# Patient Record
Sex: Female | Born: 1990 | Race: White | Hispanic: No | Marital: Married | Smoking: Never smoker
Health system: Southern US, Community
[De-identification: ages and names within clinical notes are randomized; demographics above are authoritative.]

## PROBLEM LIST (undated history)

## (undated) DIAGNOSIS — T7840XA Allergy, unspecified, initial encounter: Secondary | ICD-10-CM

## (undated) DIAGNOSIS — K589 Irritable bowel syndrome without diarrhea: Secondary | ICD-10-CM

## (undated) DIAGNOSIS — G43909 Migraine, unspecified, not intractable, without status migrainosus: Secondary | ICD-10-CM

## (undated) HISTORY — DX: Migraine, unspecified, not intractable, without status migrainosus: G43.909

## (undated) HISTORY — DX: Allergy, unspecified, initial encounter: T78.40XA

## (undated) HISTORY — DX: Irritable bowel syndrome without diarrhea: K58.9

---
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
## 2014-01-27 ENCOUNTER — Ambulatory Visit: Payer: Self-pay | Admitting: Internal Medicine

## 2014-01-28 ENCOUNTER — Ambulatory Visit (INDEPENDENT_AMBULATORY_CARE_PROVIDER_SITE_OTHER): Payer: BC Managed Care – PPO | Admitting: Internal Medicine

## 2014-01-28 ENCOUNTER — Encounter: Payer: Self-pay | Admitting: Internal Medicine

## 2014-01-28 VITALS — BP 118/72 | HR 100 | Temp 99.0°F | Ht 68.5 in | Wt 184.2 lb

## 2014-01-28 DIAGNOSIS — R110 Nausea: Secondary | ICD-10-CM

## 2014-01-28 DIAGNOSIS — R51 Headache: Secondary | ICD-10-CM

## 2014-01-28 DIAGNOSIS — R109 Unspecified abdominal pain: Secondary | ICD-10-CM

## 2014-01-28 DIAGNOSIS — R519 Headache, unspecified: Secondary | ICD-10-CM

## 2014-01-28 LAB — CBC
HCT: 36.5 % (ref 36.0–46.0)
Hemoglobin: 12.5 g/dL (ref 12.0–15.0)
MCHC: 34.2 g/dL (ref 30.0–36.0)
MCV: 85.8 fl (ref 78.0–100.0)
Platelets: 265 10*3/uL (ref 150.0–400.0)
RBC: 4.25 Mil/uL (ref 3.87–5.11)
RDW: 12.8 % (ref 11.5–14.6)
WBC: 6.4 10*3/uL (ref 4.5–10.5)

## 2014-01-28 LAB — COMPREHENSIVE METABOLIC PANEL
ALBUMIN: 3.8 g/dL (ref 3.5–5.2)
ALK PHOS: 60 U/L (ref 39–117)
ALT: 26 U/L (ref 0–35)
AST: 29 U/L (ref 0–37)
BILIRUBIN TOTAL: 0.5 mg/dL (ref 0.3–1.2)
BUN: 9 mg/dL (ref 6–23)
CO2: 20 mEq/L (ref 19–32)
Calcium: 9.5 mg/dL (ref 8.4–10.5)
Chloride: 106 mEq/L (ref 96–112)
Creatinine, Ser: 0.6 mg/dL (ref 0.4–1.2)
GFR: 124.14 mL/min (ref >60.00)
GLUCOSE: 77 mg/dL (ref 70–99)
POTASSIUM: 4 meq/L (ref 3.5–5.1)
SODIUM: 137 meq/L (ref 135–145)
TOTAL PROTEIN: 7.1 g/dL (ref 6.0–8.3)

## 2014-01-28 LAB — TSH: TSH: 1.8 u[IU]/mL (ref 0.35–5.50)

## 2014-01-28 MED ORDER — SUMATRIPTAN SUCCINATE 25 MG PO TABS: 25.0000 mg | ORAL_TABLET | ORAL | Status: DC | PRN

## 2014-01-28 MED ORDER — TOPIRAMATE 25 MG PO TABS: 25.0000 mg | ORAL_TABLET | Freq: Two times a day (BID) | ORAL | Status: DC

## 2014-01-28 NOTE — Patient Instructions (Addendum)

## 2014-01-28 NOTE — Progress Notes (Signed)
Pre visit review using our clinic review tool, if applicable. No additional management support is needed unless otherwise documented below in the visit note. 

## 2014-01-28 NOTE — Progress Notes (Signed)
HPI  Pt presents to the clinic today to establish care. She moved from [place] 18 months ago and has not established care since that time. She does have a few concerns today.    Abdominal pain nausea:  Pt states that for the past few weeks she has been waking up with lower quadrant stomach cramps, diarrhea, nausea, and occasionally some vomiting. Denies any blood in stool or vomit and denies these symptoms being related to any particular foods. States the diarrhea is worse on the weekends. Pt does have an increase of stress recently with planning a wedding and work. LMP was at the beginning of this month.  Headaches:  Pt also states that she has a history of migraines without aura and since November has had a daily HA that is achy in quality and stretches across forehead. These HAs have become worse in the last week.   Flu: Not this year  Tetanus:  LMP: 3 weeks ago  Dentist: as needed Eye Doctor: 08/2013  Past Medical History  Diagnosis Date  . Allergy     Current Outpatient Prescriptions  Medication Sig Dispense Refill  . drospirenone-ethinyl estradiol (YAZ,GIANVI,LORYNA) 3-0.02 MG tablet Take 1 tablet by mouth daily.       No current facility-administered medications for this visit.    No Known Allergies  Family History  Problem Relation Age of Onset  . Depression Mother   . Depression Maternal Uncle   . Depression Maternal Grandmother   . Heart disease Maternal Grandfather   . Hyperlipidemia Maternal Grandfather   . Hypertension Maternal Grandfather   . Diabetes Neg Hx   . Stroke Neg Hx   . Cancer Neg Hx     History   Social History  . Marital Status: Married    Spouse Name: N/A    Number of Children: N/A  . Years of Education: N/A   Occupational History  . Not on file.   Social History Main Topics  . Smoking status: Never Smoker   . Smokeless tobacco: Not on file  . Alcohol Use: 1.2 oz/week    2 Cans of beer per week     Comment: occasional  . Drug Use:  No  . Sexual Activity: Yes    Birth Control/ Protection: Pill   Other Topics Concern  . Not on file   Social History Narrative  . No narrative on file    ROS:  Constitutional: Pt admits to daily HA. Denies fever, malaise, fatigue, or abrupt weight changes.  HEENT: Pt admits to eye burning. Denies ear pain, ringing in the ears, wax buildup, runny nose, nasal congestion, bloody nose, or sore throat. Respiratory: Denies difficulty breathing, shortness of breath, cough or sputum production.   Cardiovascular: Denies chest pain, chest tightness, palpitations or swelling in the hands or feet.  Gastrointestinal: Pt admits to daily diarrhea, nausea, and occasional vomiting. Denies bloating, constipation, or blood in the stool.  Neurological: Pt admits to mild dizziness. Denies difficulty with memory, difficulty with speech or problems with balance and coordination.   No other specific complaints in a complete review of systems (except as listed in HPI above).  PE:  BP 118/72  Pulse 100  Temp(Src) 99 F (37.2 C) (Oral)  Ht 5' 8.5" (1.74 m)  Wt 184 lb 4 oz (83.575 kg)  BMI 27.60 kg/m2  SpO2 99%  LMP 01/06/2014 Wt Readings from Last 3 Encounters:  01/28/14 184 lb 4 oz (83.575 kg)    General: Pleasant, appears her stated  age, well developed, well nourished in NAD. HEENT: Head: normal shape and size; Eyes: sclera white, no icterus. Throat/Mouth: Teeth present, mucosa pink and moist, no lesions or ulcerations noted.  Cardiovascular: Normal rate and rhythm. S1,S2 noted.  No murmur, rubs or gallops noted. No JVD or BLE edema. No carotid bruits noted. Pulmonary/Chest: Normal effort and positive vesicular breath sounds. No respiratory distress. No wheezes, rales or ronchi noted.  Abdomen: Soft but tender to palpation in in LLQ and mid abdomen. Normal bowel sounds, no bruits noted. No distention or masses noted. Liver, spleen and kidneys non palpable. Neurological: Alert and  oriented. Psychiatric: Mood and affect normal. Behavior is normal. Judgment and thought content normal.   Assessment and Plan:  Headache:   Rx Topamax 25 mg Qday and instructed the pt how to appropriately titrate up Q week by 25 mg to alleviate HA sxs to a max dose of 150 mg.   Instructed pt to call and let me know what dose works for alleviating HA sxs.   Rx Imitrex 25 mg for abortive Ha therapy.    Diarrhea:   Advised the pt to take Imodium OTC and pepto bismol Q day for sx management.   Will check CBC, CMET, and TSH to evaluate for cause of diarrhea.   Will F/U with pt once have results to determine need to further work up or GI referral.

## 2014-01-31 ENCOUNTER — Telehealth: Payer: Self-pay | Admitting: Internal Medicine

## 2014-01-31 ENCOUNTER — Other Ambulatory Visit: Payer: Self-pay | Admitting: Internal Medicine

## 2014-01-31 NOTE — Telephone Encounter (Signed)
Please see msg below

## 2014-01-31 NOTE — Telephone Encounter (Signed)
Pt is calling and says that the medication that she was given to take for Headaches and stomach pain is not working and she says she feels worse than before. Please advise. [facility] [place].

## 2014-02-01 ENCOUNTER — Encounter: Payer: Self-pay | Admitting: Internal Medicine

## 2014-02-01 ENCOUNTER — Encounter: Payer: Self-pay | Admitting: Gastroenterology

## 2014-02-01 ENCOUNTER — Other Ambulatory Visit: Payer: Self-pay

## 2014-02-01 ENCOUNTER — Ambulatory Visit (INDEPENDENT_AMBULATORY_CARE_PROVIDER_SITE_OTHER): Payer: BC Managed Care – PPO | Admitting: Internal Medicine

## 2014-02-01 VITALS — BP 112/82 | HR 77 | Temp 98.7°F | Wt 182.8 lb

## 2014-02-01 DIAGNOSIS — R51 Headache: Secondary | ICD-10-CM

## 2014-02-01 DIAGNOSIS — R109 Unspecified abdominal pain: Secondary | ICD-10-CM

## 2014-02-01 DIAGNOSIS — G8929 Other chronic pain: Secondary | ICD-10-CM

## 2014-02-01 DIAGNOSIS — R519 Headache, unspecified: Secondary | ICD-10-CM

## 2014-02-01 MED ORDER — SUMATRIPTAN SUCCINATE 25 MG PO TABS: 25.0000 mg | ORAL_TABLET | ORAL | Status: DC | PRN

## 2014-02-01 MED ORDER — DICYCLOMINE HCL 10 MG PO CAPS: 10.0000 mg | ORAL_CAPSULE | Freq: Three times a day (TID) | ORAL | Status: DC

## 2014-02-01 NOTE — Telephone Encounter (Signed)
She needs a 30 min followup OV if the med is not working

## 2014-02-01 NOTE — Progress Notes (Signed)
Subjective:    Patient ID: Audrey Short, female    DOB: 11/13/1990, 23 y.o.   MRN: [id number]  HPI  Pt presents to the clinic today for follow up for abdominal pain and headaches. She has been having daily headaches since November but these have worsened in the last 2 weeks. She was started on Imitrex and Topamax on 01/28/14. Pt stated that she was taking the Topamax 25 mg BID but took 10 pills of Imitrex in 4 days mistaking it as a medication to help stomach pains. She has vomited several times since Friday when she started the Imitrex and feels lightheaded, dizzy and flushed. Pt stated that her abdominal pain has also worsened since Friday with cramping in LLQ, RLQ, LUQ and RUQ. She denies any diarrhea with last BM being this morning. Pt denies hematochezia, melena, and acid reflux.   Review of Systems  Past Medical History  Diagnosis Date  . Allergy     Current Outpatient Prescriptions  Medication Sig Dispense Refill  . drospirenone-ethinyl estradiol (YAZ,GIANVI,LORYNA) 3-0.02 MG tablet Take 1 tablet by mouth daily.      Marland Kitchen. topiramate (TOPAMAX) 25 MG tablet Take 1 tablet (25 mg total) by mouth 2 (two) times daily.  30 tablet  1   No current facility-administered medications for this visit.   No Known Allergies  Family History  Problem Relation Age of Onset  . Depression Mother   . Depression Maternal Uncle   . Depression Maternal Grandmother   . Heart disease Maternal Grandfather   . Hyperlipidemia Maternal Grandfather   . Hypertension Maternal Grandfather   . Diabetes Neg Hx   . Stroke Neg Hx   . Cancer Neg Hx    History   Social History  . Marital Status: Married    Spouse Name: N/A    Number of Children: N/A  . Years of Education: N/A   Occupational History  . Not on file.   Social History Main Topics  . Smoking status: Never Smoker   . Smokeless tobacco: Not on file  . Alcohol Use: 1.2 oz/week    2 Cans of beer per week     Comment: occasional  . Drug Use:  No  . Sexual Activity: Yes    Birth Control/ Protection: Pill   Other Topics Concern  . Not on file   Social History Narrative  . No narrative on file   Constitutional: Pt admits to HA, flushing, and dizziness. Denies fever, malaise, fatigue, or abrupt weight changes.  Respiratory: Denies difficulty breathing, shortness of breath, cough or sputum production.   Cardiovascular: Denies chest pain, chest tightness, palpitations. Gastrointestinal: Pt admits to abdominal pain, bloating, nausea, and vomiting. Pt denies constipation, diarrhea or blood in the stool.   No other specific complaints in a complete review of systems (except as listed in HPI above).  Objective:   Physical Exam  BP 112/82  Pulse 77  Temp(Src) 98.7 F (37.1 C) (Oral)  Wt 182 lb 12 oz (82.895 kg)  SpO2 98%  LMP 01/06/2014 Wt Readings from Last 3 Encounters:  02/01/14 182 lb 12 oz (82.895 kg)  01/28/14 184 lb 4 oz (83.575 kg)   General: Appears her stated age, well developed, well nourished in NAD. Cardiovascular: Normal rate and rhythm. S1,S2 noted.  No murmur, rubs or gallops noted. No JVD or BLE edema. No carotid bruits noted. Pulmonary/Chest: Normal effort and positive vesicular breath sounds. No respiratory distress. No wheezes, rales or ronchi noted.  Abdomen: Soft  and nontender. Normal bowel sounds, no bruits noted. No distention or masses noted. Liver, spleen and kidneys non palpable.  BMET    Component Value Date/Time   NA 137 01/28/2014 1104   K 4.0 01/28/2014 1104   CL 106 01/28/2014 1104   CO2 20 01/28/2014 1104   GLUCOSE 77 01/28/2014 1104   BUN 9 01/28/2014 1104   CREATININE 0.6 01/28/2014 1104   CALCIUM 9.5 01/28/2014 1104   Lipid Panel  No results found for this basename: chol, trig, hdl, cholhdl, vldl, ldlcalc   CBC    Component Value Date/Time   WBC 6.4 01/28/2014 1104   RBC 4.25 01/28/2014 1104   HGB 12.5 01/28/2014 1104   HCT 36.5 01/28/2014 1104   PLT 265.0 01/28/2014 1104   MCV 85.8  01/28/2014 1104   MCHC 34.2 01/28/2014 1104   RDW 12.8 01/28/2014 1104   Hgb A1C No results found for this basename: HGBA1C   Assessment & Plan:   Abdominal Pain:   CBC, CMET, TSH all WNL Ordered TTG and H pylori tests Rx Bentyl 10 mg  Will refer to GI after have lab results for further work up of abdominal pain   Chronic Headaches:   The worsening of these HAs is likely 2/2 taking Imitrex and Topamax inappropriately as noted above  Extensive counseling and instruction done today on how to appropriately take medicines  Instructed the pt to continue Topamax 50 mg BID with titration up to 150 mg BID for HA prophylaxis  Rx for Immitrex sent   Will F/U with the pt about Topamax titration

## 2014-02-01 NOTE — Progress Notes (Signed)
Pre visit review using our clinic review tool, if applicable. No additional management support is needed unless otherwise documented below in the visit note. 

## 2014-02-01 NOTE — Telephone Encounter (Signed)
[facility]rex [facility]

## 2014-02-01 NOTE — Telephone Encounter (Signed)
Pt made a f/u appt for today

## 2014-02-01 NOTE — Patient Instructions (Addendum)
Recurrent Migraine Headache  A migraine headache is an intense, throbbing pain on one or both sides of your head. Recurrent migraines keep coming back. A migraine can last for 30 minutes to several hours.  CAUSES   The exact cause of a migraine headache is not always known. However, a migraine may be caused when nerves in the brain become irritated and release chemicals that cause inflammation. This causes pain.  Certain things may also trigger migraines, such as:   · Alcohol.  · Smoking.  · Stress.  · Menstruation.  · Aged cheeses.  · Foods or drinks that contain nitrates, glutamate, aspartame, or tyramine.  · Lack of sleep.  · Chocolate.  · Caffeine.  · Hunger.  · Physical exertion.  · Fatigue.  · Medicines used to treat chest pain (nitroglycerine), birth control pills, estrogen, and some blood pressure medicines.  SYMPTOMS   · Pain on one or both sides of your head.  · Pulsating or throbbing pain.  · Severe pain that prevents daily activities.  · Pain that is aggravated by any physical activity.  · Nausea, vomiting, or both.  · Dizziness.  · Pain with exposure to bright lights, loud noises, or activity.  · General sensitivity to bright lights, loud noises, or smells.  Before you get a migraine, you may get warning signs that a migraine is coming (aura). An aura may include:  · Seeing flashing lights.  · Seeing bright spots, halos, or zig-zag lines.  · Having tunnel vision or blurred vision.  · Having feelings of numbness or tingling.  · Having trouble talking.  · Having muscle weakness.  DIAGNOSIS   A recurrent migraine headache is often diagnosed based on:  · Symptoms.  · Physical examination.  · A CT scan or MRI of your head. These imaging tests cannot diagnose migraines, but can help rule out other causes of headaches.    TREATMENT   Medicines may be given for pain and nausea. Medicines can also be given to help prevent recurrent migraines.  HOME CARE INSTRUCTIONS  · Only take over-the-counter or  prescription medicines for pain or discomfort as directed by your health care provider. The use of long-term narcotics is not recommended.  · Lie down in a dark, quiet room when you have a migraine.  · Keep a journal to find out what may trigger your migraine headaches. For example, write down:  · What you eat and drink.  · How much sleep you get.  · Any change to your diet or medicines.  · Limit alcohol consumption.  · Quit smoking if you smoke.  · Get 7 9 hours of sleep, or as recommended by your health care provider.  · Limit stress.  · Keep lights dim if bright lights bother you and make your migraines worse.  SEEK MEDICAL CARE IF:   · You do not get relief from the medicines given to you.  · You have a recurrence of pain.  SEEK IMMEDIATE MEDICAL CARE IF:  · Your migraine becomes severe.  · You have a fever.  · You have a stiff neck.  · You have loss of vision.  · You have muscular weakness or loss of muscle control.  · You start losing your balance or have trouble walking.  · You feel faint or pass out.  · You have severe symptoms that are different from your first symptoms.  MAKE SURE YOU:   · Understand these instructions.  · Will watch your condition.  ·   Will get help right away if you are not doing well or get worse.  Document Released: 06/18/2001 Document Revised: 07/14/2013 Document Reviewed: 05/31/2013  ExitCare® Patient Information ©2014 [employer].

## 2014-02-01 NOTE — Telephone Encounter (Signed)
Left detailed msg on VM for pt to call back and make a 30 min OV f/u--per HIPAA

## 2014-02-02 LAB — GLIA (IGA/G) + TTG IGA
GLIADIN IGA: 7.5 U/mL (ref <20)
Gliadin IgG: 4.6 U/mL (ref <20)
Tissue Transglutaminase Ab, IgA: 4.6 U/mL (ref <20)

## 2014-02-03 ENCOUNTER — Ambulatory Visit: Payer: BC Managed Care – PPO

## 2014-02-03 DIAGNOSIS — R109 Unspecified abdominal pain: Secondary | ICD-10-CM

## 2014-02-04 ENCOUNTER — Telehealth: Payer: Self-pay

## 2014-02-04 LAB — H. PYLORI ANTIBODY, IGG: H Pylori IgG: <0.4 {ISR}

## 2014-02-04 NOTE — Telephone Encounter (Signed)
[facility], labs and demographics for patient--Spoke to Ricki MillerLinda Thomas printed off needed paperwork and I faxed to their office--will keep documents in my faxed folder just if needed again

## 2014-02-07 ENCOUNTER — Other Ambulatory Visit: Payer: Self-pay

## 2014-02-07 DIAGNOSIS — R51 Headache: Principal | ICD-10-CM

## 2014-02-07 DIAGNOSIS — R519 Headache, unspecified: Secondary | ICD-10-CM

## 2014-02-07 MED ORDER — TOPIRAMATE 25 MG PO TABS: 25.0000 mg | ORAL_TABLET | Freq: Two times a day (BID) | ORAL | Status: DC

## 2014-02-07 NOTE — Telephone Encounter (Signed)
Pt left v/m; pt said [facility] NP had told pt that she could take Topiramate 25 mg two tabs twice a day. pts med list has 1 tab twice a day. Pt is out of med and [facility] [place] [facility] because too early. Pt request new rx Topiramate 25 Mg with instructions two tabs twice a day to [facility] [place]. Pt request cb. Have not changed instructions on med list.

## 2014-02-07 NOTE — Telephone Encounter (Signed)
Is the Rx supposed to state 2 tablets BID #120? Or as you have sent in already?--please advise

## 2014-02-08 NOTE — Addendum Note (Signed)
Addended by: Roena MaladyEVONTENNO, MELANIE Y on: 02/08/2014 09:11 AM   Modules accepted: Orders

## 2014-02-08 NOTE — Telephone Encounter (Signed)
Rx changed in the chart and called the pharmacy to change as well--left detailed msg on pt's VM letting her know information as previously instructed

## 2014-02-08 NOTE — Telephone Encounter (Signed)
She is supposed to be titrating up on her topomax until the effective dose. If she is taking 2 tabs BID please give her 120 and remind her to call me back when she reaches an effective dose

## 2014-02-11 ENCOUNTER — Ambulatory Visit: Payer: Self-pay | Admitting: Gastroenterology

## 2014-02-14 ENCOUNTER — Encounter: Payer: Self-pay | Admitting: General Surgery

## 2014-02-21 ENCOUNTER — Telehealth: Payer: Self-pay

## 2014-02-21 NOTE — Telephone Encounter (Signed)
Ok, please change in computer Topomax 75 mg BID # 60 (no refill at this time)

## 2014-02-21 NOTE — Telephone Encounter (Signed)
There is not a 75 mg--so i just added 50 mg in addition to the 25 mg-- are you okay with this?

## 2014-02-21 NOTE — Telephone Encounter (Signed)
Pt left v/m; pt said topamax 25 mg taking 3 tabs in morning and 3 tabs at night is controlling pts h/as.pt wanted to get med list update for next refill.

## 2014-02-21 NOTE — Telephone Encounter (Signed)
Yes

## 2014-02-22 ENCOUNTER — Ambulatory Visit (INDEPENDENT_AMBULATORY_CARE_PROVIDER_SITE_OTHER): Payer: BC Managed Care – PPO | Admitting: General Surgery

## 2014-02-22 ENCOUNTER — Encounter: Payer: Self-pay | Admitting: General Surgery

## 2014-02-22 VITALS — BP 168/74 | HR 78 | Resp 14 | Ht 68.0 in | Wt 178.0 lb

## 2014-02-22 DIAGNOSIS — K819 Cholecystitis, unspecified: Secondary | ICD-10-CM | POA: Insufficient documentation

## 2014-02-22 NOTE — Patient Instructions (Signed)
Laparoscopic Cholecystectomy °Laparoscopic cholecystectomy is surgery to remove the gallbladder. The gallbladder is located in the upper right part of the abdomen, behind the liver. It is a storage sac for bile produced in the liver. Bile aids in the digestion and absorption of fats. Cholecystectomy is often done for inflammation of the gallbladder (cholecystitis). This condition is usually caused by a buildup of gallstones (cholelithiasis) in your gallbladder. Gallstones can block the flow of bile, resulting in inflammation and pain. In severe cases, emergency surgery may be required. When emergency surgery is not required, you will have time to prepare for the procedure. °Laparoscopic surgery is an alternative to open surgery. Laparoscopic surgery has a shorter recovery time. Your common bile duct may also need to be examined during the procedure. If stones are found in the common bile duct, they may be removed. °LET YOUR HEALTH CARE PROVIDER KNOW ABOUT: °· Any allergies you have. °· All medicines you are taking, including vitamins, herbs, eye drops, creams, and over-the-counter medicines. °· Previous problems you or members of your family have had with the use of anesthetics. °· Any blood disorders you have. °· Previous surgeries you have had. °· Medical conditions you have. °RISKS AND COMPLICATIONS °Generally, this is a safe procedure. However, as with any procedure, complications can occur. Possible complications include: °· Infection. °· Damage to the common bile duct, nerves, arteries, veins, or other internal organs such as the stomach, liver, or intestines. °· Bleeding. °· A stone may remain in the common bile duct. °· A bile leak from the cyst duct that is clipped when your gallbladder is removed. °· The need to convert to open surgery, which requires a larger incision in the abdomen. This may be necessary if your surgeon thinks it is not safe to continue with a laparoscopic procedure. °BEFORE THE  PROCEDURE °· Ask your health care provider about changing or stopping any regular medicines. You will need to stop taking aspirin or blood thinners at least 5 days prior to surgery. °· Do not eat or drink anything after midnight the night before surgery. °· Let your health care provider know if you develop a cold or other infectious problem before surgery. °PROCEDURE  °· You will be given medicine to make you sleep through the procedure (general anesthetic). A breathing tube will be placed in your mouth. °· When you are asleep, your surgeon will make several small cuts (incisions) in your abdomen. °· A thin, lighted tube with a tiny camera on the end (laparoscope) is inserted through one of the small incisions. The camera on the laparoscope sends a picture to a TV screen in the operating room. This gives the surgeon a good view inside your abdomen. °· A gas will be pumped into your abdomen. This expands your abdomen so that the surgeon has more room to perform the surgery. °· Other tools needed for the procedure are inserted through the other incisions. The gallbladder is removed through one of the incisions. °· After the removal of your gallbladder, the incisions will be closed with stitches, staples, or skin glue. °AFTER THE PROCEDURE °· You will be taken to a recovery area where your progress will be checked often. °· You may be allowed to go home the same day if your pain is controlled and you can tolerate liquids. °Document Released: 09/23/2005 Document Revised: 07/14/2013 Document Reviewed: 05/05/2013 °ExitCare® Patient Information ©2014 [employer]. ° °

## 2014-02-22 NOTE — Progress Notes (Signed)
Patient ID: Audrey Short, female   DOB: 01/13/1991, 23 y.o.   MRN: [id number]  Chief Complaint  Patient presents with  . Other    gallbladder    HPI Audrey Short is a 23 y.o. female here today for a evaluation of her gallbladder. Patient states she had a HIDA scan and ultrasound done on May 8,2015 . She states she is having abdominal pain in her right upper quadrant and has been going for about a year now. All foods make the pain worse .The  pain usually happen about 30 minutes after eating. She describes the pain as a sharp, stabbing pain. The pain is primarily in the right upper quadrant with some radiation of the left upper quadrant. States she is having diarrhea and vomiting. Family history of gallbladder problems (sister, mother and maternal grandmother).   Recently completed his abdominal ultrasound was negative. HIDA scan showed an ejection fraction of 80%, but she had reproduction of her pain beginning in the right upper quadrant moving of the left upper quadrant during CCK injection.  The patient reports that the institution of Bentyl has in some ways dull the pain she had been experiencing any postprandial timeframe, but has worsened her nausea, vomiting and loose stools.   HPI  Past Medical History  Diagnosis Date  . Allergy   . IBS (irritable bowel syndrome)   . Migraine     Past Surgical History  Procedure Laterality Date  . Tonsillectomy  2011    Family History  Problem Relation Age of Onset  . Depression Mother   . Depression Maternal Uncle   . Depression Maternal Grandmother   . Heart disease Maternal Grandfather   . Hyperlipidemia Maternal Grandfather   . Hypertension Maternal Grandfather   . Diabetes Neg Hx   . Stroke Neg Hx   . Cancer Neg Hx     Social History History  Substance Use Topics  . Smoking status: Never Smoker   . Smokeless tobacco: Never Used  . Alcohol Use: 1.2 oz/week    2 Cans of beer per week     Comment: occasional    No Known  Allergies  Current Outpatient Prescriptions  Medication Sig Dispense Refill  . dicyclomine (BENTYL) 10 MG capsule Take 1 capsule (10 mg total) by mouth 4 (four) times daily -  before meals and at bedtime.  30 capsule  2  . drospirenone-ethinyl estradiol (YAZ,GIANVI,LORYNA) 3-0.02 MG tablet Take 1 tablet by mouth daily.      . SUMAtriptan (IMITREX) 25 MG tablet Take 1 tablet (25 mg total) by mouth every 2 (two) hours as needed for migraine or headache. May repeat in 2 hours if headache persists or recurs.  10 tablet  0  . topiramate (TOPAMAX) 25 MG tablet Take 25 mg by mouth 2 (two) times daily.        No current facility-administered medications for this visit.    Review of Systems Review of Systems  Constitutional: Negative.   Respiratory: Negative.   Cardiovascular: Negative.   Gastrointestinal: Positive for nausea, vomiting and diarrhea. Negative for abdominal pain, constipation, blood in stool, abdominal distention, anal bleeding and rectal pain.    Blood pressure 168/74, pulse 78, resp. rate 14, height 5\' 8"  (1.727 m), weight 178 lb (80.74 kg), last menstrual period 02/08/2014.  Physical Exam Physical Exam  Constitutional: She is oriented to person, place, and time. She appears well-developed and well-nourished.  Eyes: Conjunctivae are normal. No scleral icterus.  Neck: Neck supple.  Cardiovascular:  Normal rate, regular rhythm and normal heart sounds.   Pulmonary/Chest: Effort normal and breath sounds normal.  Abdominal: Soft. Normal appearance and bowel sounds are normal. There is no hepatomegaly. There is no tenderness.  Neurological: She is alert and oriented to person, place, and time.  Skin: Skin is warm and dry.    Data Reviewed Recently completed hospital ultrasound and HIDA scan reviewed. Ultrasound report, bile duct diameter of 3.9 mm. Previous laboratory studies including a CBC, celiac panel, H. Pylori and comprehensive metabolic panel are reported as normal.  GI  consultation note from Owens Sharkawn Harrison, nurse practitioner dated Feb 08, 2014 was reviewed.  Assessment    Suspected biliary colic.     Plan    The high ejection fraction and reproduction of her pain makes me suspicious for occult cholecystitis. I explained to the patient that there can be no guarantee that cholecystectomy will relieve her symptoms, but considering the reproduction of pain during CCK infusion I think it's likely that she will improve. The risks associated with surgery including those of bleeding, infection and injury to the biliary tree were discussed in detail.       PCP: Dartha LodgeBaity, [place]    Jeffrey W Byrnett 02/22/2014, 9:04 PM

## 2014-02-23 ENCOUNTER — Other Ambulatory Visit: Payer: Self-pay | Admitting: General Surgery

## 2014-02-23 DIAGNOSIS — K819 Cholecystitis, unspecified: Secondary | ICD-10-CM

## 2014-03-03 ENCOUNTER — Other Ambulatory Visit: Payer: Self-pay

## 2014-03-03 MED ORDER — TOPIRAMATE 25 MG PO TABS: 25.0000 mg | ORAL_TABLET | Freq: Two times a day (BID) | ORAL | Status: DC

## 2014-03-03 NOTE — Telephone Encounter (Signed)
Last filled 02/07/14--pt is requesting a #90 day supply--please advise--*I have not changed quantity in chart*

## 2014-03-07 ENCOUNTER — Ambulatory Visit: Payer: BC Managed Care – PPO | Admitting: Gastroenterology

## 2014-03-11 ENCOUNTER — Ambulatory Visit: Payer: Self-pay | Admitting: General Surgery

## 2014-03-11 ENCOUNTER — Encounter: Payer: Self-pay | Admitting: General Surgery

## 2014-03-11 DIAGNOSIS — K8010 Calculus of gallbladder with chronic cholecystitis without obstruction: Secondary | ICD-10-CM

## 2014-03-14 ENCOUNTER — Encounter: Payer: Self-pay | Admitting: General Surgery

## 2014-03-14 LAB — PATHOLOGY REPORT

## 2014-03-15 HISTORY — PX: CHOLECYSTECTOMY: SHX55

## 2014-03-22 ENCOUNTER — Encounter: Payer: Self-pay | Admitting: General Surgery

## 2014-03-22 ENCOUNTER — Ambulatory Visit (INDEPENDENT_AMBULATORY_CARE_PROVIDER_SITE_OTHER): Payer: Self-pay | Admitting: General Surgery

## 2014-03-22 VITALS — BP 122/72 | HR 76 | Resp 12 | Ht 68.0 in | Wt 177.0 lb

## 2014-03-22 DIAGNOSIS — K819 Cholecystitis, unspecified: Secondary | ICD-10-CM

## 2014-03-22 NOTE — Progress Notes (Signed)
Patient ID: Audrey Short, female   DOB: 03/24/1991, 23 y.o.   MRN: [id number]  Chief Complaint  Patient presents with  . Routine Post Op    gallbladder    HPI Audrey Short is a 23 y.o. female here today for her post op gallbladder done on 03/15/14. Patient states she is doing well. The patient has had no recurrent abdominal pain since surgery. No dietary intolerance. Normal bowel function. She did report a little bit of swelling at the umbilical area which has since resolved. HPI  Past Medical History  Diagnosis Date  . Allergy   . IBS (irritable bowel syndrome)   . Migraine     Past Surgical History  Procedure Laterality Date  . Tonsillectomy  2011  . Cholecystectomy  03/15/14    Family History  Problem Relation Age of Onset  . Depression Mother   . Depression Maternal Uncle   . Depression Maternal Grandmother   . Heart disease Maternal Grandfather   . Hyperlipidemia Maternal Grandfather   . Hypertension Maternal Grandfather   . Diabetes Neg Hx   . Stroke Neg Hx   . Cancer Neg Hx     Social History History  Substance Use Topics  . Smoking status: Never Smoker   . Smokeless tobacco: Never Used  . Alcohol Use: 1.2 oz/week    2 Cans of beer per week     Comment: occasional    No Known Allergies  Current Outpatient Prescriptions  Medication Sig Dispense Refill  . dicyclomine (BENTYL) 10 MG capsule Take 1 capsule (10 mg total) by mouth 4 (four) times daily -  before meals and at bedtime.  30 capsule  2  . drospirenone-ethinyl estradiol (YAZ,GIANVI,LORYNA) 3-0.02 MG tablet Take 1 tablet by mouth daily.      . SUMAtriptan (IMITREX) 25 MG tablet Take 1 tablet (25 mg total) by mouth every 2 (two) hours as needed for migraine or headache. May repeat in 2 hours if headache persists or recurs.  10 tablet  0  . topiramate (TOPAMAX) 25 MG tablet Take 1 tablet (25 mg total) by mouth 2 (two) times daily.  180 tablet  1   No current facility-administered medications for this visit.     Review of Systems Review of Systems  Constitutional: Negative.   Respiratory: Negative.   Cardiovascular: Negative.     Blood pressure 122/72, pulse 76, resp. rate 12, height 5\' 8"  (1.727 m), weight 177 lb (80.287 kg), last menstrual period 02/08/2014.  Physical Exam Physical Exam  Constitutional: She appears well-developed and well-nourished.  Abdominal: Soft. Normal appearance and bowel sounds are normal.    Port sites looks clean and healing well.     Data Reviewed Pathology showed early chronic cholecystitis with polypoid cholesterolosis. No dysplasia or malignancy.  Assessment    Doing well post cholecystectomy.     Plan    The patient was released to full activity. Care was strenuous lifting was discussed. Follow up will be on an as-needed basis.     PCP: Donnetta HutchingBaity, Regina    Byrnett, Jeffrey W 03/22/2014, 7:14 PM

## 2014-03-22 NOTE — Patient Instructions (Signed)
Patient to return as needed. 

## 2014-03-30 ENCOUNTER — Encounter: Payer: Self-pay | Admitting: General Surgery

## 2014-03-30 ENCOUNTER — Encounter: Payer: Self-pay | Admitting: *Deleted

## 2014-06-27 ENCOUNTER — Encounter: Payer: Self-pay | Admitting: Internal Medicine

## 2014-06-27 ENCOUNTER — Encounter (INDEPENDENT_AMBULATORY_CARE_PROVIDER_SITE_OTHER): Payer: Self-pay

## 2014-06-27 ENCOUNTER — Ambulatory Visit (INDEPENDENT_AMBULATORY_CARE_PROVIDER_SITE_OTHER): Payer: BC Managed Care – PPO | Admitting: Internal Medicine

## 2014-06-27 VITALS — BP 114/76 | HR 76 | Temp 98.6°F | Wt 180.0 lb

## 2014-06-27 DIAGNOSIS — B9789 Other viral agents as the cause of diseases classified elsewhere: Secondary | ICD-10-CM

## 2014-06-27 DIAGNOSIS — J329 Chronic sinusitis, unspecified: Secondary | ICD-10-CM

## 2014-06-27 NOTE — Progress Notes (Signed)
HPI  Pt presents to the clinic today with c/o facial pain and pressure, ear pain, runny nose and cough. She reports that the ear pain started 2 weeks ago but the other symptoms started yesterday. The mucous from her nose is clear. The cough is unproductive. She denies fever, chills or body aches. She has tried. She does have a history of seasonal allergies.  Review of Systems    Past Medical History  Diagnosis Date  . Allergy   . IBS (irritable bowel syndrome)   . Migraine     Family History  Problem Relation Age of Onset  . Depression Mother   . Depression Maternal Uncle   . Depression Maternal Grandmother   . Heart disease Maternal Grandfather   . Hyperlipidemia Maternal Grandfather   . Hypertension Maternal Grandfather   . Diabetes Neg Hx   . Stroke Neg Hx   . Cancer Neg Hx     History   Social History  . Marital Status: Married    Spouse Name: N/A    Number of Children: N/A  . Years of Education: N/A   Occupational History  . Not on file.   Social History Main Topics  . Smoking status: Never Smoker   . Smokeless tobacco: Never Used  . Alcohol Use: 1.2 oz/week    2 Cans of beer per week     Comment: occasional  . Drug Use: No  . Sexual Activity: Yes    Birth Control/ Protection: Pill   Other Topics Concern  . Not on file   Social History Narrative  . No narrative on file    No Known Allergies   Constitutional: Positive headache, fatigue. Denies fever or abrupt weight changes.  HEENT:  Positive facial pain, nasal congestion and sore throat. Denies eye redness, ear pain, ringing in the ears, wax buildup, runny nose or bloody nose. Respiratory: Positive cough. Denies difficulty breathing or shortness of breath.  Cardiovascular: Denies chest pain, chest tightness, palpitations or swelling in the hands or feet.   No other specific complaints in a complete review of systems (except as listed in HPI above).  Objective:  BP 114/76  Pulse 76  Temp(Src)  98.6 F (37 C) (Oral)  Wt 180 lb (81.647 kg)  SpO2 98%.   General: Appears her stated age, well developed, well nourished in NAD. HEENT: Head: normal shape and size, maxillary sinus tenderness noted; Eyes: sclera white, no icterus, conjunctiva pink, PERRLA and EOMs intact; Ears: Tm's gray and intact, normal light reflex; Nose: mucosa pink and moist, septum midline; Throat/Mouth: + PND. Teeth present, mucosa pink and moist, no exudate noted, no lesions or ulcerations noted.  Neck: Neck supple, trachea midline. No massses, lumps or thyromegaly present.  Cardiovascular: Normal rate and rhythm. S1,S2 noted.  No murmur, rubs or gallops noted. No JVD or BLE edema. No carotid bruits noted. Pulmonary/Chest: Normal effort and positive vesicular breath sounds. No respiratory distress. No wheezes, rales or ronchi noted.      Assessment & Plan:   Acute viral sinusitis  Can use a Neti Pot which can be purchased from your local drug store. Flonase 2 sprays each nostril for 3 days and then as needed. Zyrtec OTC daily Rest and push fluids  RTC as needed or if symptoms persist.

## 2014-06-27 NOTE — Patient Instructions (Addendum)

## 2014-06-27 NOTE — Progress Notes (Signed)
Pre visit review using our clinic review tool, if applicable. No additional management support is needed unless otherwise documented below in the visit note. 

## 2014-08-24 IMAGING — NM NUCLEAR MEDICINE HEPATOHBILIARY INCLUDE GB
2 series · 16 of 16 positions shown · non-contrast
Comparison: None.

CLINICAL DATA: Abdominal pain with nausea and vomiting.

EXAM:
NUCLEAR MEDICINE HEPATOBILIARY IMAGING WITH GALLBLADDER EF
TECHNIQUE: Sequential images of the abdomen were obtained [DATE] minutes
following intravenous administration of radiopharmaceutical. After
slow intravenous infusion of 1.65 micrograms Cholecystokinin,
gallbladder ejection fraction was determined.
RADIOPHARMACEUTICALS:  7.69 [id number] Choletec

[Series 1000: gallbladder ef · 4.80mm/px · 6 of 120 frames shown]
[frame 11/120]
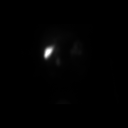
[frame 31/120]
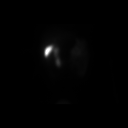
[frame 51/120]
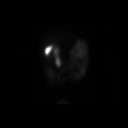
[frame 71/120]
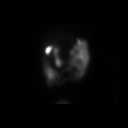
[frame 91/120]
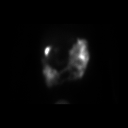
[frame 111/120]
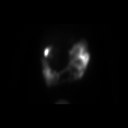

[Series 1000: gallbladder statics · 4.80mm/px · 10 of 10 slices shown]
[im 1/10]
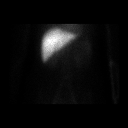
[im 2/10]
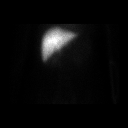
[im 3/10]
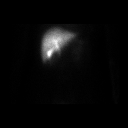
[im 4/10]
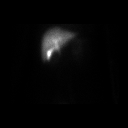
[im 5/10]
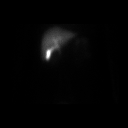
[im 6/10]
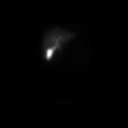
[im 7/10]
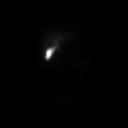
[im 8/10]
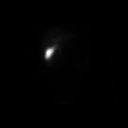
[im 9/10]
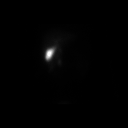
[im 10/10]
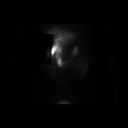

[16 of 16 positions shown; findings below may reference images not displayed]

FINDINGS: The gallbladder was visualized at 10 min. Activity is seen in the
bowel at 60 min. Ejection fraction was 80%. At 30 min, normal
ejection fraction is greater than 30%.

The patient did experience sharp pains in the left upper quadrant
during CCK infusion.
IMPRESSION: Normal hepatobiliary scan.  Normal ejection fraction.

## 2014-08-29 ENCOUNTER — Other Ambulatory Visit: Payer: Self-pay | Admitting: *Deleted

## 2014-08-29 MED ORDER — TOPIRAMATE 25 MG PO TABS: 25.0000 mg | ORAL_TABLET | Freq: Two times a day (BID) | ORAL | Status: DC

## 2014-09-21 IMAGING — CR DG CHOLANGIOGRAM OPERATIVE
2 series · 15 of 20 positions shown · non-contrast
Comparison: None.

CLINICAL DATA: ft 27 seconds, cholelithiasis

EXAM:
INTRAOPERATIVE CHOLANGIOGRAM
TECHNIQUE: Cholangiographic images from the C-arm fluoroscopic device were
submitted for interpretation post-operatively. Please see the
procedural report for the amount of contrast and the fluoroscopy
time utilized.

[Series 7: cont. · 7 of 31 frames shown (1 of 2)]
[frame 1/31]
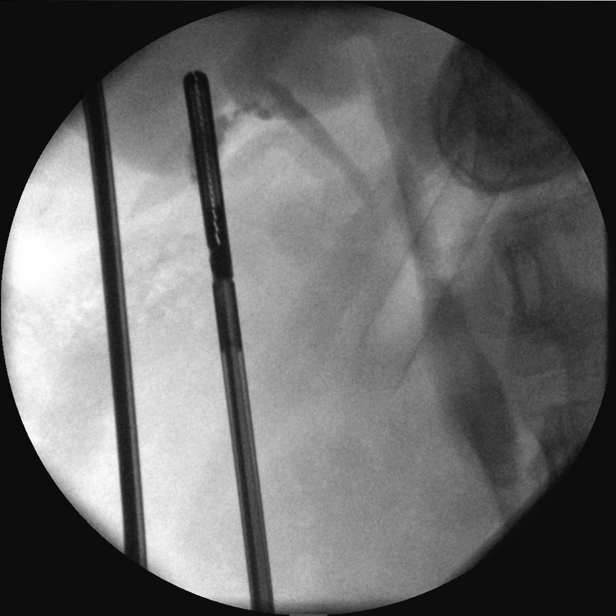
[frame 7/31]
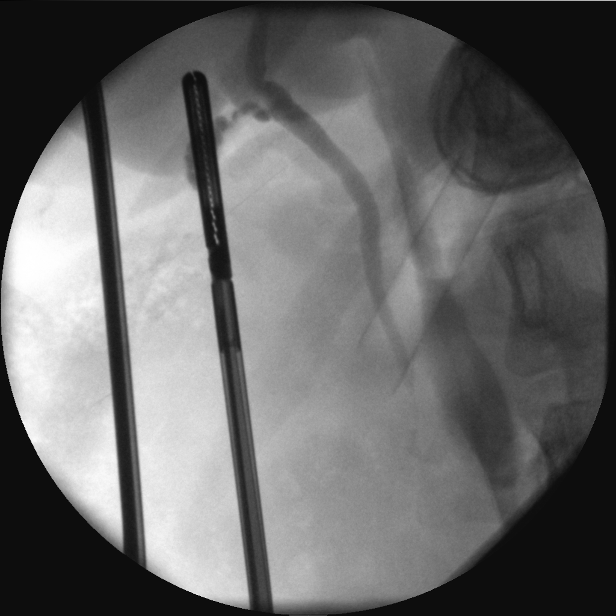
[frame 11/31]
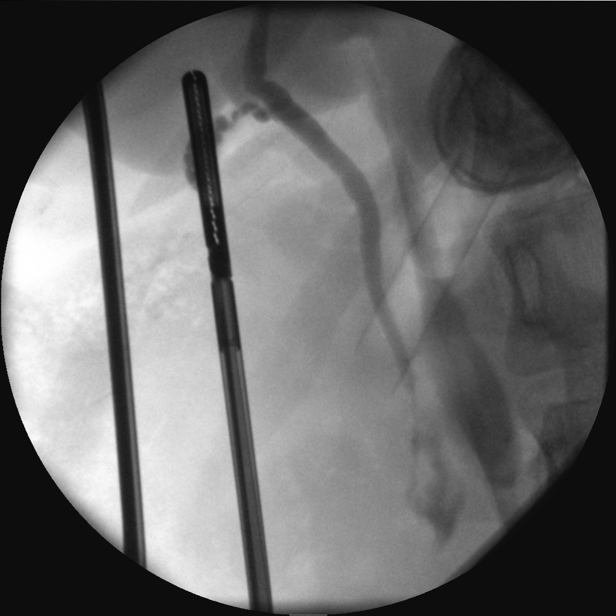
[frame 14/31]
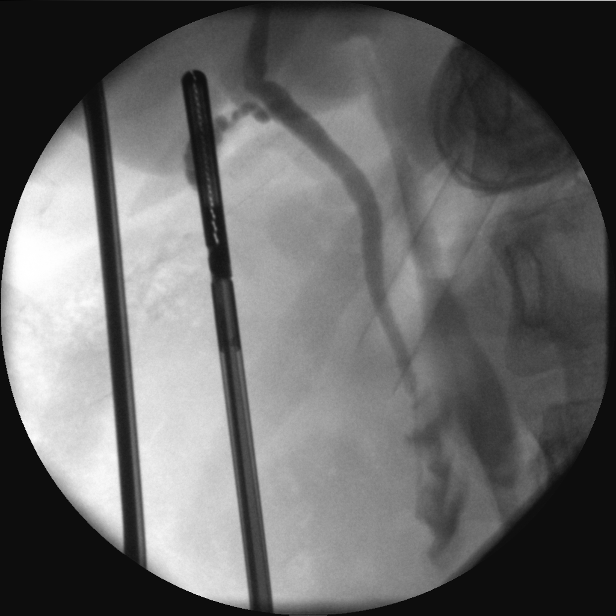
[frame 21/31]
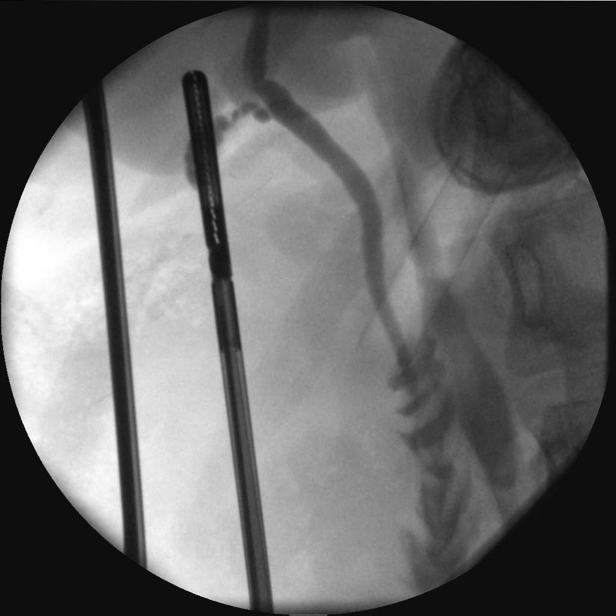
[frame 24/31]
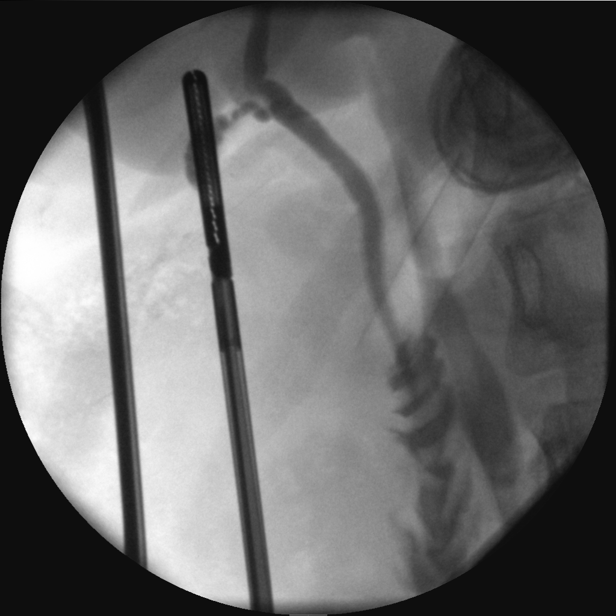
[frame 27/31]
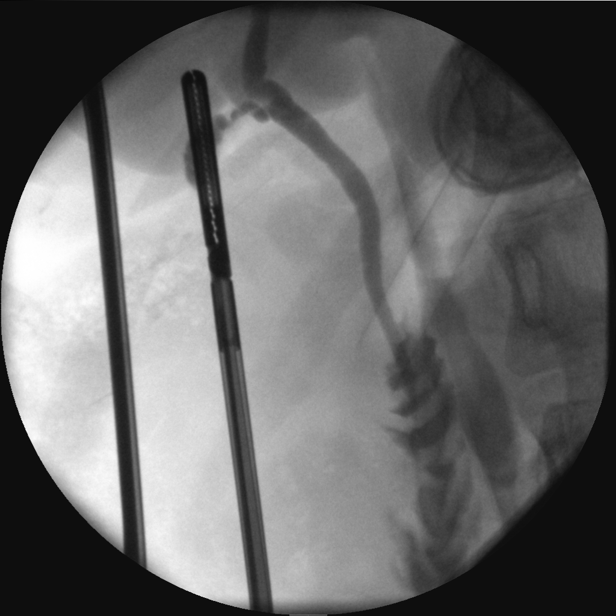

[Series 8: cont. · 8 of 20 frames shown (2 of 2)]
[frame 1/20]
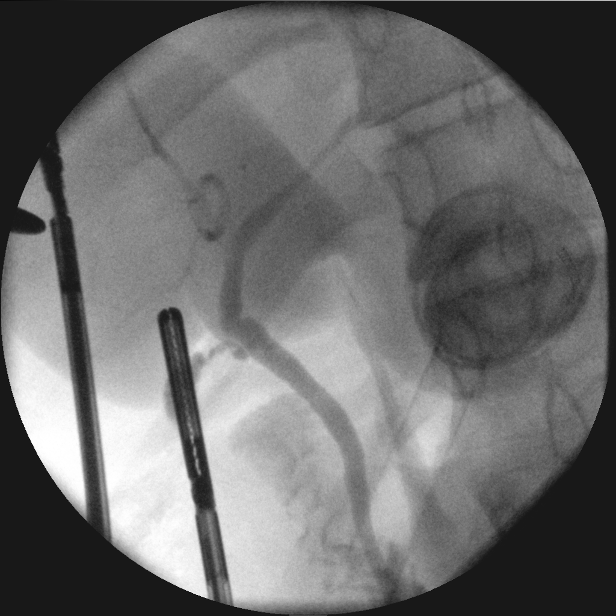
[frame 3/20]
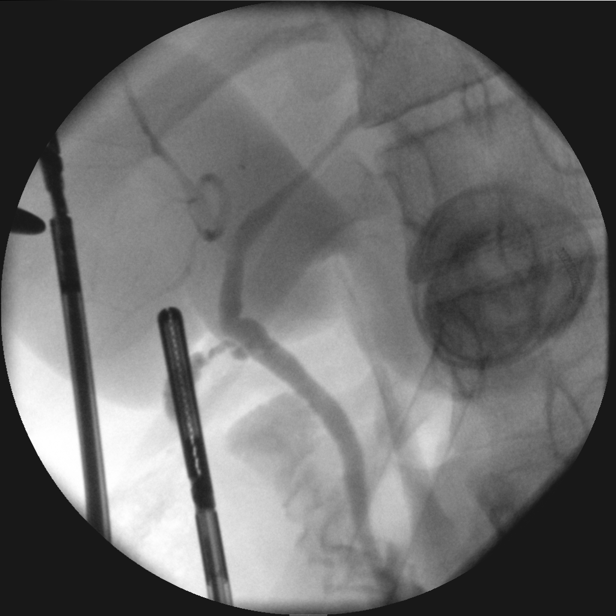
[frame 5/20]
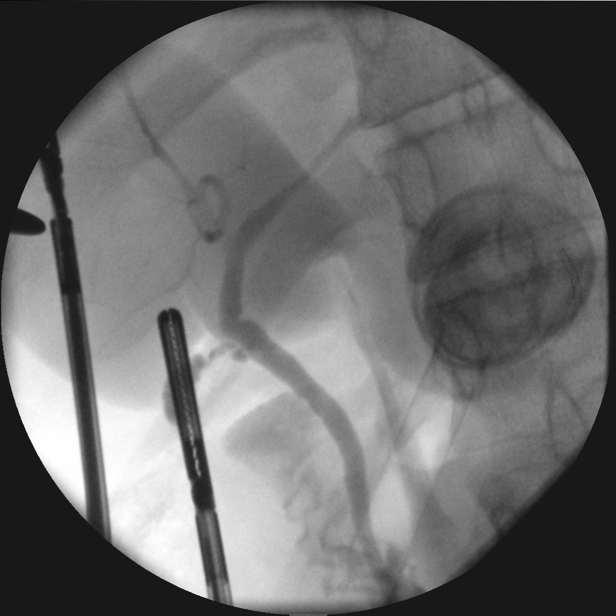
[frame 9/20]
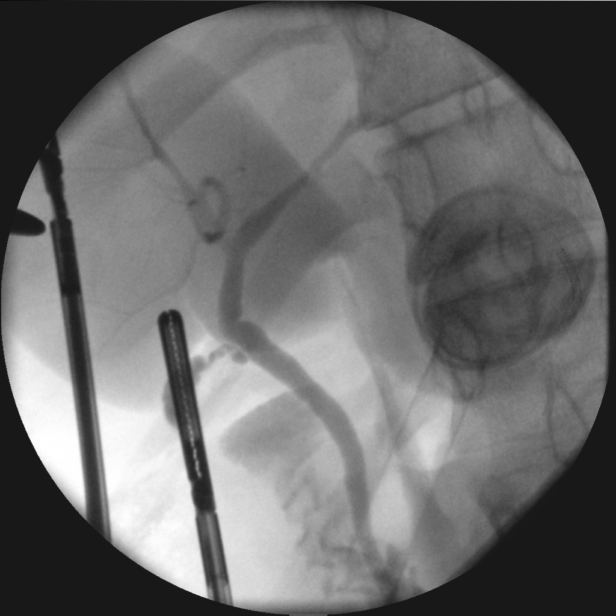
[frame 11/20]
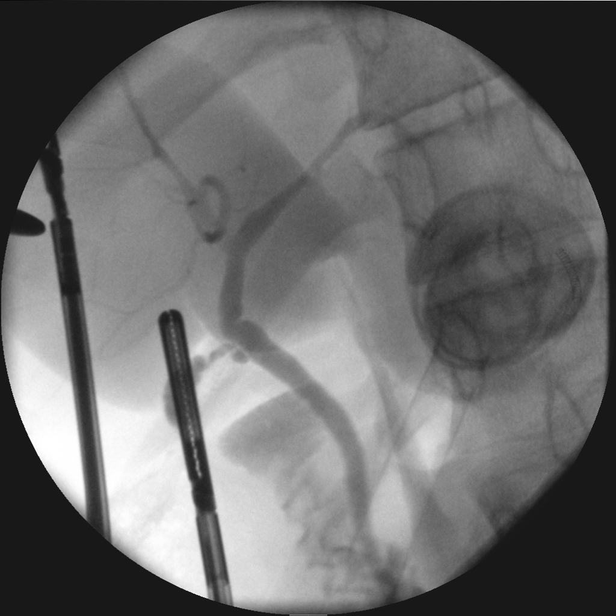
[frame 13/20]
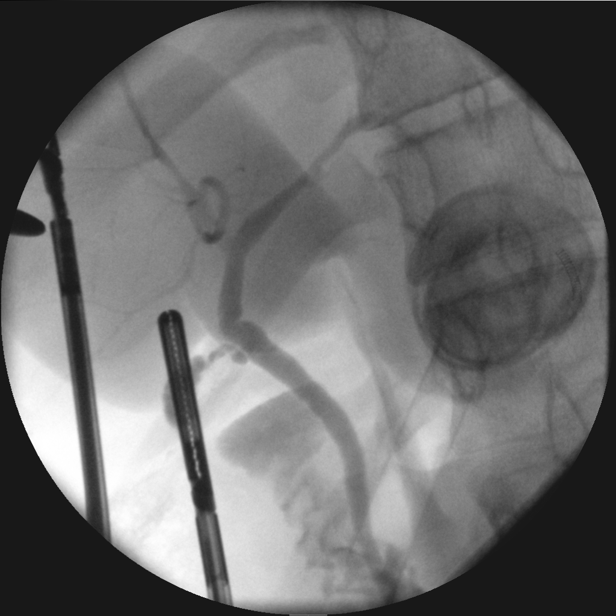
[frame 17/20]
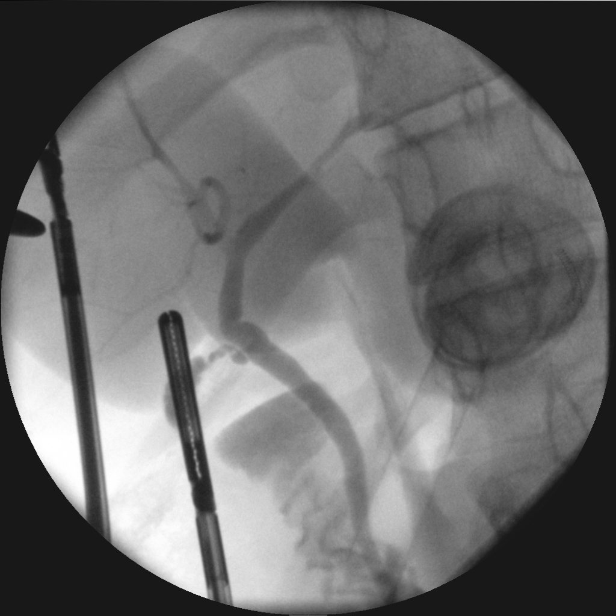
[frame 20/20]
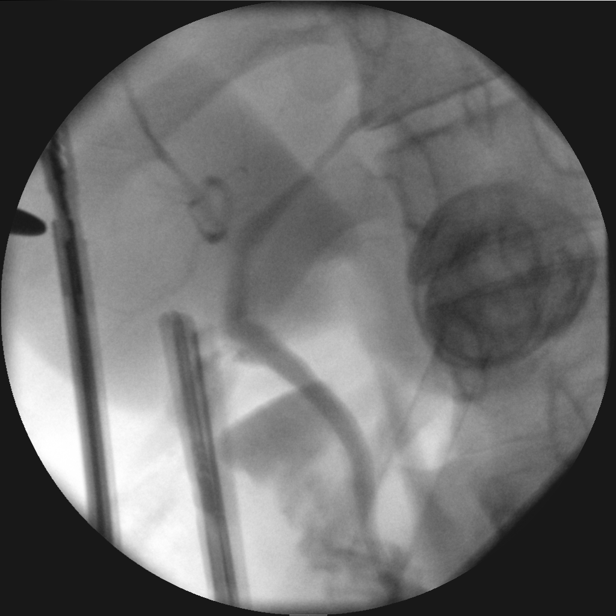

[15 of 20 positions shown; findings below may reference images not displayed]

FINDINGS: No persistent filling defects in the common duct. Intrahepatic ducts
are incompletely visualized, appearing decompressed centrally.
Contrast passes into the duodenum.

:
Negative for retained common duct stone.

## 2015-01-28 NOTE — Op Note (Signed)
PATIENT NAME:  Audrey EdisonLLEN, [name] MR#:  [id number] DATE OF BIRTH:  [birth year]  DATE OF PROCEDURE:  03/11/2014  PREOPERATIVE DIAGNOSIS: Chronic cholecystitis.   POSTOPERATIVE DIAGNOSIS: Chronic cholecystitis.   OPERATIVE PROCEDURE: Laparoscopic cholecystectomy with intraoperative cholangiograms.   SURGEON: Donnalee CurryJeffrey Carliss Quast, MD   ANESTHESIA: General endotracheal under Dr. Henrene HawkingKephart.   ESTIMATED BLOOD LOSS: Minimal.   CLINICAL NOTE: This 24 year old woman has had postprandial pain. Ultrasound was negative for stones. HIDA scan showed a high ejection fraction and reproduction of her symptoms with CCK injection. She was felt to be a candidate for a cholecystectomy.   OPERATIVE NOTE: With the patient under adequate general endotracheal anesthesia, the abdomen was prepped with ChloraPrep and draped. In Trendelenburg position, a Veress needle was placed through a transumbilical incision. After assuring intra-abdominal location with a hanging drop test, the abdomen was insufflated with CO2 at 10 mmHg pressure, a 10-mm step port was expanded and inspection showed no evidence of injury from initial port placement. The patient was placed in reverse Trendelenburg position and rolled to the left. An 11-mm Xcel port was placed in the epigastrium and two 5-mm step ports placed on the right lateral abdominal wall under direct vision. There was noted to be an adhesion from the omentum and appendices epiploicae to the area just below the umbilicus in the midline. At the end of the procedure this was taken down with cautery dissection without trauma to the colon. The gallbladder showed chronic scarring especially near the neck. It was placed on cephalad traction and the neck of the gallbladder was exposed. A Kumar clamp was placed and fluoroscopic cholangiograms completed using 9 mL of 1/2 strength Conray 60. There was prompt filling of the right and left hepatic ducts and free flow into the duodenum. No evidence of retained  stones or obstruction. The cystic duct and branches of the cystic artery were doubly clipped and divided. The gallbladder was removed from the liver bed making use of hook cautery dissection. A small rent was controlled with the graspers. No stones were identified. The gallbladder was delivered through the umbilical port site without incident. Inspection showed extensive cholesterolosis but no stones. The right upper quadrant was irrigated with lactated Ringer's solution. Good hemostasis was appreciated. The abdomen was then desufflated and ports removed under direct vision after removal of the small adhesion to the intra-abdominal wall noted above. Skin incisions were closed with 4-0 Vicryl subcuticular sutures. Benzoin, Steri-Strips, Telfa, and Tegaderm dressing were then applied.    ____________________________ Earline MayotteJeffrey W. Dvaughn Fickle, MD jwb:lt D: 03/11/2014 15:55:01 ET T: 03/11/2014 22:13:07 ET JOB#: [id number]  cc: Earline MayotteJeffrey W. Destine Ambroise, MD, <Dictator> Rodman Keyawn S. Harrison, NP Zed Wanninger Brion AlimentW Cecil Bixby MD ELECTRONICALLY SIGNED 03/15/2014 20:39

## 2015-05-30 ENCOUNTER — Other Ambulatory Visit: Payer: Self-pay

## 2015-05-30 MED ORDER — TOPIRAMATE 25 MG PO TABS: 25.0000 mg | ORAL_TABLET | Freq: Two times a day (BID) | ORAL | Status: DC

## 2015-06-01 ENCOUNTER — Ambulatory Visit (INDEPENDENT_AMBULATORY_CARE_PROVIDER_SITE_OTHER): Payer: BLUE CROSS/BLUE SHIELD | Admitting: Internal Medicine

## 2015-06-01 ENCOUNTER — Encounter: Payer: Self-pay | Admitting: Internal Medicine

## 2015-06-01 VITALS — BP 112/72 | HR 104 | Temp 99.6°F | Wt 186.0 lb

## 2015-06-01 DIAGNOSIS — A084 Viral intestinal infection, unspecified: Secondary | ICD-10-CM

## 2015-06-01 MED ORDER — ONDANSETRON HCL 4 MG PO TABS
4.0000 mg | ORAL_TABLET | Freq: Three times a day (TID) | ORAL | Status: DC | PRN
Start: 2015-06-01 — End: 2015-08-11

## 2015-06-01 NOTE — Patient Instructions (Signed)

## 2015-06-01 NOTE — Progress Notes (Signed)
Subjective:    Patient ID: Audrey Short, female    DOB: [birth year], 24 y.o.   MRN: [id number]  HPI  Pt presents to the clinic today with c/o nausea and diarrhea. This started 2 days ago. She has not vomited. She has some associated LLQ pain. She describes the pain as sharp and tight. She has run a fever up to 101 last night. She is having loose stools about every hour. She has not had any blood in her stool. She has not eaten out of the ordinary. She has not drink contaminated water. She denies urinary or vaginal complaints. She has had sick contacts with similar symptoms. She has had a cholecystectomy in 2015.  Review of Systems      Past Medical History  Diagnosis Date  . Allergy   . IBS (irritable bowel syndrome)   . Migraine     Current Outpatient Prescriptions  Medication Sig Dispense Refill  . drospirenone-ethinyl estradiol (YAZ,GIANVI,LORYNA) 3-0.02 MG tablet Take 1 tablet by mouth daily.    . SUMAtriptan (IMITREX) 25 MG tablet Take 1 tablet (25 mg total) by mouth every 2 (two) hours as needed for migraine or headache. May repeat in 2 hours if headache persists or recurs. 10 tablet 0  . topiramate (TOPAMAX) 25 MG tablet Take 1 tablet (25 mg total) by mouth 2 (two) times daily. NEED TO SCHEDULE ANNUAL PHYSICAL FOR FURTHER REFILLS [phone] 180 tablet 0   No current facility-administered medications for this visit.    No Known Allergies  Family History  Problem Relation Age of Onset  . Depression Mother   . Depression Maternal Uncle   . Depression Maternal Grandmother   . Heart disease Maternal Grandfather   . Hyperlipidemia Maternal Grandfather   . Hypertension Maternal Grandfather   . Diabetes Neg Hx   . Stroke Neg Hx   . Cancer Neg Hx     Social History   Social History  . Marital Status: Married    Spouse Name: N/A  . Number of Children: N/A  . Years of Education: N/A   Occupational History  . Not on file.   Social History Main Topics  . Smoking  status: Never Smoker   . Smokeless tobacco: Never Used  . Alcohol Use: 1.2 oz/week    2 Cans of beer per week     Comment: occasional  . Drug Use: No  . Sexual Activity: Yes    Birth Control/ Protection: Pill   Other Topics Concern  . Not on file   Social History Narrative  . No narrative on file     Constitutional: Denies fever, malaise, fatigue, headache or abrupt weight changes.  Respiratory: Denies difficulty breathing, shortness of breath, cough or sputum production.   Cardiovascular: Denies chest pain, chest tightness, palpitations or swelling in the hands or feet.  Gastrointestinal: Pt reports abdominal pain, nausea and diarrhea. Denies bloating, constipation, or blood in the stool.  GU: Denies urgency, frequency, pain with urination, burning sensation, blood in urine, odor or discharge.  No other specific complaints in a complete review of systems (except as listed in HPI above).  Objective:   Physical Exam  BP 112/72 mmHg  Pulse 104  Temp(Src) 99.6 F (37.6 C) (Oral)  Wt 186 lb (84.369 kg)  SpO2 98%  LMP 05/01/2015 Wt Readings from Last 3 Encounters:  06/01/15 186 lb (84.369 kg)  06/27/14 180 lb (81.647 kg)  03/22/14 177 lb (80.287 kg)    General: Appears her stated age,  well developed, well nourished in NAD. Cardiovascular: Normal rate and rhythm. S1,S2 noted.  No murmur, rubs or gallops noted.  Pulmonary/Chest: Normal effort and positive vesicular breath sounds. No respiratory distress. No wheezes, rales or ronchi noted.  Abdomen: Soft and nontender. Hyperactive bowel sounds, no bruits noted.   BMET    Component Value Date/Time   NA 137 01/28/2014 1104   K 4.0 01/28/2014 1104   CL 106 01/28/2014 1104   CO2 20 01/28/2014 1104   GLUCOSE 77 01/28/2014 1104   BUN 9 01/28/2014 1104   CREATININE 0.6 01/28/2014 1104   CALCIUM 9.5 01/28/2014 1104    Lipid Panel  No results found for: CHOL, TRIG, HDL, CHOLHDL, VLDL, LDLCALC  CBC    Component Value  Date/Time   WBC 6.4 01/28/2014 1104   RBC 4.25 01/28/2014 1104   HGB 12.5 01/28/2014 1104   HCT 36.5 01/28/2014 1104   PLT 265.0 01/28/2014 1104   MCV 85.8 01/28/2014 1104   MCHC 34.2 01/28/2014 1104   RDW 12.8 01/28/2014 1104    Hgb A1C No results found for: HGBA1C       Assessment & Plan:   Viral Gastroenteritis:  Push fluids eRx for Zofran Q8H prn for nausea Avoid antidiarrheals at this time Consume a bland diet, avoid dairy, spicy, caffeine etc Tylenol as needed for fever  RTC as needed or if symptoms persist or worsen

## 2015-06-01 NOTE — Progress Notes (Signed)
Pre visit review using our clinic review tool, if applicable. No additional management support is needed unless otherwise documented below in the visit note. 

## 2015-06-05 ENCOUNTER — Telehealth: Payer: Self-pay | Admitting: Internal Medicine

## 2015-06-05 ENCOUNTER — Ambulatory Visit (INDEPENDENT_AMBULATORY_CARE_PROVIDER_SITE_OTHER): Payer: BLUE CROSS/BLUE SHIELD | Admitting: Internal Medicine

## 2015-06-05 ENCOUNTER — Encounter: Payer: Self-pay | Admitting: Internal Medicine

## 2015-06-05 VITALS — BP 108/70 | HR 76 | Temp 97.9°F | Wt 186.0 lb

## 2015-06-05 DIAGNOSIS — R197 Diarrhea, unspecified: Secondary | ICD-10-CM | POA: Diagnosis not present

## 2015-06-05 DIAGNOSIS — R748 Abnormal levels of other serum enzymes: Secondary | ICD-10-CM | POA: Diagnosis not present

## 2015-06-05 NOTE — Progress Notes (Signed)
Pre visit review using our clinic review tool, if applicable. No additional management support is needed unless otherwise documented below in the visit note. 

## 2015-06-05 NOTE — Patient Instructions (Signed)

## 2015-06-05 NOTE — Progress Notes (Signed)
Subjective:    Patient ID: Audrey Short, female    DOB: Dec 10, 1990, 24 y.o.   MRN: [id number]  HPI  Pt presents to the clinic today with c/o continued nausea and diarrhea. This started 5 days ago. She was seen 8/25 for the same, diagnosed with viral gastroenteritis, advised to push fluids and take Zofran as needed. She reports she is not any better. She still has nausea but no vomiting. She does have diarrhea, and reports she has had at least 10 bilious stools today already. The stool is yellow/green in color with no odor. She denies abdominal cramping or pain. She has not seen blood in her stool. Even water goes straight through her. She has no appetite. She has not taken anything OTC. She stopped her birth control a few months ago and is not sure if this is related. She has not had a change in diet. She travelled to [place] July 4th but has not had any issues until just recently. She does have a history of IBS and has had her gallbladder removed. She does have a stressful job but denies increase in anxiety.  Review of Systems      Past Medical History  Diagnosis Date  . Allergy   . IBS (irritable bowel syndrome)   . Migraine     Current Outpatient Prescriptions  Medication Sig Dispense Refill  . drospirenone-ethinyl estradiol (YAZ,GIANVI,LORYNA) 3-0.02 MG tablet Take 1 tablet by mouth daily.    . ondansetron (ZOFRAN) 4 MG tablet Take 1 tablet (4 mg total) by mouth every 8 (eight) hours as needed. 20 tablet 0  . SUMAtriptan (IMITREX) 25 MG tablet Take 1 tablet (25 mg total) by mouth every 2 (two) hours as needed for migraine or headache. May repeat in 2 hours if headache persists or recurs. (Patient not taking: Reported on 06/01/2015) 10 tablet 0  . topiramate (TOPAMAX) 25 MG tablet Take 1 tablet (25 mg total) by mouth 2 (two) times daily. NEED TO SCHEDULE ANNUAL PHYSICAL FOR FURTHER REFILLS [phone] (Patient not taking: Reported on 06/01/2015) 180 tablet 0   No current  facility-administered medications for this visit.    No Known Allergies  Family History  Problem Relation Age of Onset  . Depression Mother   . Depression Maternal Uncle   . Depression Maternal Grandmother   . Heart disease Maternal Grandfather   . Hyperlipidemia Maternal Grandfather   . Hypertension Maternal Grandfather   . Diabetes Neg Hx   . Stroke Neg Hx   . Cancer Neg Hx     Social History   Social History  . Marital Status: Married    Spouse Name: N/A  . Number of Children: N/A  . Years of Education: N/A   Occupational History  . Not on file.   Social History Main Topics  . Smoking status: Never Smoker   . Smokeless tobacco: Never Used  . Alcohol Use: 1.2 oz/week    2 Cans of beer per week     Comment: occasional  . Drug Use: No  . Sexual Activity: Yes    Birth Control/ Protection: Pill   Other Topics Concern  . Not on file   Social History Narrative     Constitutional: Denies fever, malaise, fatigue, headache or abrupt weight changes.  Respiratory: Denies difficulty breathing, shortness of breath, cough or sputum production.   Cardiovascular: Denies chest pain, chest tightness, palpitations or swelling in the hands or feet.  Gastrointestinal: Pt reports diarrhea. Denies abdominal pain, bloating, constipation,  or blood in the stool.  GU: Denies urgency, frequency, pain with urination, burning sensation, blood in urine, odor or discharge.  No other specific complaints in a complete review of systems (except as listed in HPI above).  Objective:   Physical Exam   BP 108/70 mmHg  Pulse 76  Temp(Src) 97.9 F (36.6 C) (Oral)  Wt 186 lb (84.369 kg)  SpO2 98%  LMP 05/01/2015 Wt Readings from Last 3 Encounters:  06/05/15 186 lb (84.369 kg)  06/01/15 186 lb (84.369 kg)  06/27/14 180 lb (81.647 kg)    General: Appears her stated age, well developed, well nourished in NAD. Cardiovascular: Normal rate and rhythm. S1,S2 noted.  No murmur, rubs or  gallops noted.  Pulmonary/Chest: Normal effort and positive vesicular breath sounds. No respiratory distress. No wheezes, rales or ronchi noted.  Abdomen: Soft and mildly tender in the epigastric area. Hyperactivel bowel sounds, no bruits noted. No distention or masses noted. Neurological: Alert and oriented.   BMET    Component Value Date/Time   NA 137 01/28/2014 1104   K 4.0 01/28/2014 1104   CL 106 01/28/2014 1104   CO2 20 01/28/2014 1104   GLUCOSE 77 01/28/2014 1104   BUN 9 01/28/2014 1104   CREATININE 0.6 01/28/2014 1104   CALCIUM 9.5 01/28/2014 1104    Lipid Panel  No results found for: CHOL, TRIG, HDL, CHOLHDL, VLDL, LDLCALC  CBC    Component Value Date/Time   WBC 6.4 01/28/2014 1104   RBC 4.25 01/28/2014 1104   HGB 12.5 01/28/2014 1104   HCT 36.5 01/28/2014 1104   PLT 265.0 01/28/2014 1104   MCV 85.8 01/28/2014 1104   MCHC 34.2 01/28/2014 1104   RDW 12.8 01/28/2014 1104    Hgb A1C No results found for: HGBA1C      Assessment & Plan:   Diarrhea:  Continue to push fluids Diet given for diarrhea Will check CBC, CMET, Cdiff and stool culture today Continue to avoid antidiarrheals at this time until stool studies are back Consider referral to GI pending labs  Will follow up after labs, RTC as needed

## 2015-06-05 NOTE — Telephone Encounter (Signed)
Patient Name: GAILE ALLMON DOB: 02/12/1991 Initial Comment caller states she has diarrhea Nurse Assessment Nurse: Yetta Barre, RN, Miranda Date/Time (Eastern Time): 06/05/2015 10:42:45 AM Confirm and document reason for call. If symptomatic, describe symptoms. ---Caller states she has had diarrhea and nausea for 5 days. Denies vomiting. Has the patient traveled out of the country within the last 30 days? ---No Does the patient require triage? ---Yes Related visit to physician within the last 2 weeks? ---No Does the PT have any chronic conditions? (i.e. diabetes, asthma, etc.) ---No Did the patient indicate they were pregnant? ---No Guidelines Guideline Title Affirmed Question Affirmed Notes Diarrhea [1] MODERATE diarrhea (e.g., 4-6 times / day more than normal) AND [2] present > 48 hours (2 days) Final Disposition User See Physician within 24 Hours Yetta Barre, RN, Miranda Comments Appt scheduled for today at 3:45p with Nicki Reaper. Referrals REFERRED TO PCP OFFICE Disagree/Comply: Comply

## 2015-06-06 LAB — COMPREHENSIVE METABOLIC PANEL
ALT: 60 U/L — AB (ref 0–35)
AST: 39 U/L — ABNORMAL HIGH (ref 0–37)
Albumin: 4.4 g/dL (ref 3.5–5.2)
Alkaline Phosphatase: 94 U/L (ref 39–117)
BUN: 7 mg/dL (ref 6–23)
CO2: 26 meq/L (ref 19–32)
Calcium: 9.5 mg/dL (ref 8.4–10.5)
Chloride: 103 mEq/L (ref 96–112)
Creatinine, Ser: 0.68 mg/dL (ref 0.40–1.20)
GFR: 112.37 mL/min (ref >60.00)
GLUCOSE: 75 mg/dL (ref 70–99)
POTASSIUM: 3.2 meq/L — AB (ref 3.5–5.1)
Sodium: 140 mEq/L (ref 135–145)
Total Bilirubin: 0.5 mg/dL (ref 0.2–1.2)
Total Protein: 7.5 g/dL (ref 6.0–8.3)

## 2015-06-06 LAB — CBC
HEMATOCRIT: 38.7 % (ref 36.0–46.0)
HEMOGLOBIN: 13 g/dL (ref 12.0–15.0)
MCHC: 33.6 g/dL (ref 30.0–36.0)
MCV: 84.9 fl (ref 78.0–100.0)
Platelets: 244 10*3/uL (ref 150.0–400.0)
RBC: 4.56 Mil/uL (ref 3.87–5.11)
RDW: 13 % (ref 11.5–15.5)
WBC: 7.1 10*3/uL (ref 4.0–10.5)

## 2015-06-07 LAB — C. DIFFICILE GDH AND TOXIN A/B
C. difficile GDH: NOT DETECTED
C. difficile Toxin A/B: NOT DETECTED

## 2015-06-08 NOTE — Addendum Note (Signed)
Addended by: Roena Malady on: 06/08/2015 03:35 PM   Modules accepted: Orders

## 2015-06-10 LAB — STOOL CULTURE

## 2015-06-13 ENCOUNTER — Other Ambulatory Visit: Payer: BLUE CROSS/BLUE SHIELD

## 2015-06-13 DIAGNOSIS — R748 Abnormal levels of other serum enzymes: Secondary | ICD-10-CM

## 2015-06-14 LAB — ACUTE HEP PANEL AND HEP B SURFACE AB
HCV Ab: NEGATIVE
HEP A IGM: NONREACTIVE
HEP B C IGM: NONREACTIVE
HEP B S AG: NEGATIVE

## 2015-06-15 ENCOUNTER — Encounter: Payer: BLUE CROSS/BLUE SHIELD | Admitting: Internal Medicine

## 2015-08-11 ENCOUNTER — Ambulatory Visit (INDEPENDENT_AMBULATORY_CARE_PROVIDER_SITE_OTHER): Payer: BLUE CROSS/BLUE SHIELD | Admitting: Primary Care

## 2015-08-11 ENCOUNTER — Encounter: Payer: Self-pay | Admitting: Primary Care

## 2015-08-11 VITALS — BP 116/78 | HR 67 | Temp 98.1°F | Ht 68.0 in | Wt 189.1 lb

## 2015-08-11 DIAGNOSIS — M62838 Other muscle spasm: Secondary | ICD-10-CM

## 2015-08-11 DIAGNOSIS — M6248 Contracture of muscle, other site: Secondary | ICD-10-CM | POA: Diagnosis not present

## 2015-08-11 MED ORDER — CYCLOBENZAPRINE HCL 5 MG PO TABS: 5.0000 mg | ORAL_TABLET | Freq: Three times a day (TID) | ORAL | Status: DC | PRN

## 2015-08-11 NOTE — Progress Notes (Signed)
Subjective:    Patient ID: Audrey Short, female    DOB: 05/25/1991, 24 y.o.   MRN: [id number]  HPI  Ms. Audrey Short is a 24 year old female who presents today with a chief complaint of neck and shoulder pain. Her pain is located to the left side. She first noticed her pain upon waking on 08/01/15. Her pain has gradually become better until this morning when she woke up and noticed it more tense. She's taken ibuprofen, used heading pads, applying "muscle relaxer cream" without resolve. Denies recent injury, fevers, chills, cough, back pain.   Review of Systems  Constitutional: Negative for fever and chills.  HENT: Negative for rhinorrhea.   Respiratory: Negative for cough.   Musculoskeletal: Positive for neck pain. Negative for back pain.       Pain to left lateral neck.  Neurological: Negative for numbness.       Past Medical History  Diagnosis Date  . Allergy   . IBS (irritable bowel syndrome)   . Migraine     Social History   Social History  . Marital Status: Married    Spouse Name: N/A  . Number of Children: N/A  . Years of Education: N/A   Occupational History  . Not on file.   Social History Main Topics  . Smoking status: Never Smoker   . Smokeless tobacco: Never Used  . Alcohol Use: 1.2 oz/week    2 Cans of beer per week     Comment: occasional  . Drug Use: No  . Sexual Activity: Yes    Birth Control/ Protection: Pill   Other Topics Concern  . Not on file   Social History Narrative    Past Surgical History  Procedure Laterality Date  . Tonsillectomy  2011  . Cholecystectomy  03/15/14    Family History  Problem Relation Age of Onset  . Depression Mother   . Depression Maternal Uncle   . Depression Maternal Grandmother   . Heart disease Maternal Grandfather   . Hyperlipidemia Maternal Grandfather   . Hypertension Maternal Grandfather   . Diabetes Neg Hx   . Stroke Neg Hx   . Cancer Neg Hx     No Known Allergies  Current Outpatient Prescriptions  on File Prior to Visit  Medication Sig Dispense Refill  . drospirenone-ethinyl estradiol (YAZ,GIANVI,LORYNA) 3-0.02 MG tablet Take 1 tablet by mouth daily.    . SUMAtriptan (IMITREX) 25 MG tablet Take 1 tablet (25 mg total) by mouth every 2 (two) hours as needed for migraine or headache. May repeat in 2 hours if headache persists or recurs. (Patient not taking: Reported on 06/01/2015) 10 tablet 0  . topiramate (TOPAMAX) 25 MG tablet Take 1 tablet (25 mg total) by mouth 2 (two) times daily. NEED TO SCHEDULE ANNUAL PHYSICAL FOR FURTHER REFILLS [phone] (Patient not taking: Reported on 06/01/2015) 180 tablet 0   No current facility-administered medications on file prior to visit.    BP 116/78 mmHg  Pulse 67  Temp(Src) 98.1 F (36.7 C) (Oral)  Ht 5\' 8"  (1.727 m)  Wt 189 lb 1.9 oz (85.784 kg)  BMI 28.76 kg/m2  SpO2 99%  LMP 07/27/2015    Objective:   Physical Exam  Constitutional: She appears well-nourished.  Neck: Neck supple. No spinous process tenderness and no muscular tenderness present.  Decreased ROM due to pain with neck extension. Otherwise normal ROM  Cardiovascular: Normal rate and regular rhythm.   Pulmonary/Chest: Effort normal and breath sounds normal.  Skin: Skin  is warm and dry.          Assessment & Plan:  Torticollis:  Neck pain since waking last week, improvement with ibuprofen and heat until yesterday after waking. Decreased ROM to neck extension, otherwise unremarkable exam. Discussed support treatment with nsaids, heat, stretching. RX for flexeril to use HS. Follow up PRN if no improvement

## 2015-08-11 NOTE — Progress Notes (Signed)
Pre visit review using our clinic review tool, if applicable. No additional management support is needed unless otherwise documented below in the visit note. 

## 2015-08-11 NOTE — Patient Instructions (Signed)
Your pain is related to muscle spasms in the neck.  You may take Cyclobenzaprine (Flexeril) up to three times daily as needed for muscle spasms, but please start taking at bedtime as this medication may make you drowsy.  Continue apply heat. You may take 600 mg of ibuprofen three times daily as needed for pain.  Please notify me if no improvement in 1 week.  It was a pleasure meeting you!

## 2016-02-23 ENCOUNTER — Encounter: Payer: Self-pay | Admitting: Internal Medicine

## 2016-02-23 ENCOUNTER — Ambulatory Visit (INDEPENDENT_AMBULATORY_CARE_PROVIDER_SITE_OTHER): Admitting: Internal Medicine

## 2016-02-23 VITALS — BP 116/80 | HR 62 | Temp 98.8°F | Wt 189.5 lb

## 2016-02-23 DIAGNOSIS — M542 Cervicalgia: Secondary | ICD-10-CM | POA: Diagnosis not present

## 2016-02-23 NOTE — Progress Notes (Signed)
Pre visit review using our clinic review tool, if applicable. No additional management support is needed unless otherwise documented below in the visit note. 

## 2016-02-23 NOTE — Patient Instructions (Signed)
Acute Torticollis °Torticollis is a condition in which the muscles of the neck tighten (contract) abnormally, causing the neck to twist and the head to move into an unnatural position. Torticollis that develops suddenly is called acute torticollis. If torticollis becomes chronic and is left untreated, the face and neck can become deformed. °CAUSES °This condition may be caused by: °· Sleeping in an awkward position (common). °· Extending or twisting the neck muscles beyond their normal position. °· Infection. °In some cases, the cause may not be known. °SYMPTOMS °Symptoms of this condition include: °· An unnatural position of the head. °· Neck pain. °· A limited ability to move the neck. °· Twisting of the neck to one side. °DIAGNOSIS °This condition is diagnosed with a physical exam. You may also have imaging tests, such as an X-ray, CT scan, or MRI. °TREATMENT °Treatment for this condition involves trying to relax the neck muscles. It may include: °· Medicines or shots. °· Physical therapy. °· Surgery. This may be done in severe cases. °HOME CARE INSTRUCTIONS °· Take medicines only as directed by your health care provider. °· Do stretching exercises and massage your neck as directed by your health care provider. °· Keep all follow-up visits as directed by your health care provider. This is important. °SEEK MEDICAL CARE IF: °· You develop a fever. °SEEK IMMEDIATE MEDICAL CARE IF: °· You develop difficulty breathing. °· You develop noisy breathing (stridor). °· You start drooling. °· You have trouble swallowing or have pain with swallowing. °· You develop numbness or weakness in your hands or feet. °· You have changes in your speech, understanding, or vision. °· Your pain gets worse. °  °This information is not intended to replace advice given to you by your health care provider. Make sure you discuss any questions you have with your health care provider. °  °Document Released: 09/20/2000 Document Revised:  02/07/2015 Document Reviewed: 09/19/2014 °Elsevier Interactive Patient Education ©2016 [employer] ° °

## 2016-02-23 NOTE — Progress Notes (Signed)
Subjective:    Patient ID: Audrey Short, female    DOB: 1991-09-27, 25 y.o.   MRN: [id number]  HPI  Pt presents to the clinic today with c/o left side neck pain and stiffness. This started yesterday. She describes the pain as sore, achy and tight. She denies headache, blurred vision or dizziness. The pain radiates to her shoulders. She thinks she slept wrong, and has had this similar issue before. She has tried Product manager and Ibuprofen without any relief. She was given Flexeril last time this happened but reports that didn't help either. She denies any injury to the area but has been more stressed lately.  Review of Systems  Past Medical History  Diagnosis Date  . Allergy   . IBS (irritable bowel syndrome)   . Migraine     Current Outpatient Prescriptions  Medication Sig Dispense Refill  . tretinoin (RETIN-A) 0.05 % cream Apply topically at bedtime.      No current facility-administered medications for this visit.    No Known Allergies  Family History  Problem Relation Age of Onset  . Depression Mother   . Depression Maternal Uncle   . Depression Maternal Grandmother   . Heart disease Maternal Grandfather   . Hyperlipidemia Maternal Grandfather   . Hypertension Maternal Grandfather   . Diabetes Neg Hx   . Stroke Neg Hx   . Cancer Neg Hx     Social History   Social History  . Marital Status: Married    Spouse Name: N/A  . Number of Children: N/A  . Years of Education: N/A   Occupational History  . Not on file.   Social History Main Topics  . Smoking status: Never Smoker   . Smokeless tobacco: Never Used  . Alcohol Use: 1.2 oz/week    2 Cans of beer per week     Comment: occasional  . Drug Use: No  . Sexual Activity: Yes    Birth Control/ Protection: Pill   Other Topics Concern  . Not on file   Social History Narrative     Constitutional: Denies fever, malaise, fatigue, headache or abrupt weight changes.  Respiratory: Denies difficulty  breathing, shortness of breath, cough or sputum production.   Cardiovascular: Denies chest pain, chest tightness, palpitations or swelling in the hands or feet.  Musculoskeletal: Pt reports neck pain and stiffness. Denies decrease in range of motion, difficulty with gait, or joint pain and swelling.  Neurological: Denies dizziness, difficulty with memory, difficulty with speech or problems with balance and coordination.  Psych: Pt reports stress. Denies anxiety, depression, SI/HI.  No other specific complaints in a complete review of systems (except as listed in HPI above).     Objective:   Physical Exam    BP 116/80 mmHg  Pulse 62  Temp(Src) 98.8 F (37.1 C) (Oral)  Wt 189 lb 8 oz (85.957 kg)  SpO2 98%  LMP 01/18/2016 Wt Readings from Last 3 Encounters:  02/23/16 189 lb 8 oz (85.957 kg)  08/11/15 189 lb 1.9 oz (85.784 kg)  06/05/15 186 lb (84.369 kg)    General: Appears her stated age, in NAD. HEENT: Head: normal shape and size; Eyes: sclera white, no icterus, conjunctiva pink, PERRLA and EOMs intact;  Cardiovascular: Normal rate and rhythm. S1,S2 noted.  No murmur, rubs or gallops noted.  Pulmonary/Chest: Normal effort and positive vesicular breath sounds. No respiratory distress. No wheezes, rales or ronchi noted.  Musculoskeletal: Decreased flexion. Normal extension and rotation. No bony tenderness noted  over the spine. Pain with palpation of the left sternocleidomastoid and left trapezius.  Neurological: Alert and oriented.  Psychiatric: She is tearful, talking about her husband who is about to be deployed.  BMET    Component Value Date/Time   NA 140 06/05/2015 1626   K 3.2* 06/05/2015 1626   CL 103 06/05/2015 1626   CO2 26 06/05/2015 1626   GLUCOSE 75 06/05/2015 1626   BUN 7 06/05/2015 1626   CREATININE 0.68 06/05/2015 1626   CALCIUM 9.5 06/05/2015 1626    Lipid Panel  No results found for: CHOL, TRIG, HDL, CHOLHDL, VLDL, LDLCALC  CBC    Component Value  Date/Time   WBC 7.1 06/05/2015 1626   RBC 4.56 06/05/2015 1626   HGB 13.0 06/05/2015 1626   HCT 38.7 06/05/2015 1626   PLT 244.0 06/05/2015 1626   MCV 84.9 06/05/2015 1626   MCHC 33.6 06/05/2015 1626   RDW 13.0 06/05/2015 1626    Hgb A1C No results found for: HGBA1C     Assessment & Plan:   Neck pain secondary to muscle tension:  Advised her to continue Ibuprofen and heat Encouraged her to get a massage Handout given with neck exercises Should improve with time  RTC as needed or if symptoms persist or worsen

## 2018-10-07 NOTE — L&D Delivery Note (Signed)
       Delivery Note   Audrey Short is a 28 y.o. G1P0000 at [redacted]w[redacted]d Estimated Date of Delivery: 09/29/19  PRE-OPERATIVE DIAGNOSIS:  1) [redacted]w[redacted]d pregnancy.   POST-OPERATIVE DIAGNOSIS:  1) [redacted]w[redacted]d pregnancy s/p Vaginal, Spontaneous   Delivery Type: Vaginal, Spontaneous    Delivery Anesthesia: Epidural   Labor Complications:   none   ESTIMATED BLOOD LOSS: 500 ml    FINDINGS:   1) female infant, Apgar scores of   8 at 1 minute and  9  at 5 minutes and a birthweight has not been completed , infant remains skin to skin.Marland Kitchen    2) Nuchal cord: no  SPECIMENS:   PLACENTA:   Appearance: Intact , 3 vessel cord, Cord blood sample collected   Removal: Spontaneous      Disposition:  pt request to take it home. Placed in cooler.   DISPOSITION:  Infant to left in stable condition in the delivery room, with L&D personnel and mother,  NARRATIVE SUMMARY: Labor course:  [name] is a G1P0000 at [redacted]w[redacted]d who presented for induction of labor.  She progressed well in labor with pitocin.  She received the appropriate epidural anesthesia and proceeded to complete dilation. She evidenced good maternal expulsive effort during the second stage. She went on to deliver a viable female infant "[name]". The placenta delivered without problems and was noted to be complete. A perineal and vaginal examination was performed. Episiotomy/Lacerations:2nd degree Vaginal  repaired with 3-0 vicryl Rapide suture. Right and left labial abrasions that were hemostatic not in need of repair. The patient tolerated this well.  Philip Aspen, CNM  10/03/2019 10:41 PM

## 2019-02-16 ENCOUNTER — Telehealth: Payer: Self-pay

## 2019-02-16 NOTE — Telephone Encounter (Signed)
Coronavirus (COVID-19) Are you at risk?  Are you at risk for the Coronavirus (COVID-19)?  To be considered HIGH RISK for Coronavirus (COVID-19), you have to meet the following criteria:  . Traveled to [place], [place], [place], [place] or [place]; or in the [place] to [place], [place], [place], or [place]; and have fever, cough, and shortness of breath within the last 2 weeks of travel OR . Been in close contact with a person diagnosed with COVID-19 within the last 2 weeks and have fever, cough, and shortness of breath . IF YOU DO NOT MEET THESE CRITERIA, YOU ARE CONSIDERED LOW RISK FOR COVID-19.  What to do if you are HIGH RISK for COVID-19?  . If you are having a medical emergency, call 911. . Seek medical care right away. Before you go to a doctor's office, urgent care or emergency department, call ahead and tell them about your recent travel, contact with someone diagnosed with COVID-19, and your symptoms. You should receive instructions from your physician's office regarding next steps of care.  . When you arrive at healthcare provider, tell the healthcare staff immediately you have returned from visiting [place], [place], [place], [place] or [place]; or traveled in the [place] to [place], [place], [place], or [place]; in the last two weeks or you have been in close contact with a person diagnosed with COVID-19 in the last 2 weeks.   . Tell the health care staff about your symptoms: fever, cough and shortness of breath. . After you have been seen by a medical provider, you will be either: o Tested for (COVID-19) and discharged home on quarantine except to seek medical care if symptoms worsen, and asked to  - Stay home and avoid contact with others until you get your results (4-5 days)  - Avoid travel on public transportation if possible (such as bus, train, or airplane) or o Sent to the Emergency Department by EMS for evaluation, COVID-19 testing, and possible  admission depending on your condition and test results.  What to do if you are LOW RISK for COVID-19?  Reduce your risk of any infection by using the same precautions used for avoiding the common cold or flu:  . Wash your hands often with soap and warm water for at least 20 seconds.  If soap and water are not readily available, use an alcohol-based hand sanitizer with at least 60% alcohol.  . If coughing or sneezing, cover your mouth and nose by coughing or sneezing into the elbow areas of your shirt or coat, into a tissue or into your sleeve (not your hands). . Avoid shaking hands with others and consider head nods or verbal greetings only. . Avoid touching your eyes, nose, or mouth with unwashed hands.  . Avoid close contact with people who are Audrey Short. . Avoid places or events with large numbers of people in one location, like concerts or sporting events. . Carefully consider travel plans you have or are making. . If you are planning any travel outside or inside the [place], visit the CDC's Travelers' Health webpage for the latest health notices. . If you have some symptoms but not all symptoms, continue to monitor at home and seek medical attention if your symptoms worsen. . If you are having a medical emergency, call 911.  02/16/19 SCREENING NEG SLS ADDITIONAL HEALTHCARE OPTIONS FOR PATIENTS  [facility] / e-Visit: [email or web address]         [facility]: [phone]    [facility]: [phone]                   [facility]: [phone]  

## 2019-02-17 ENCOUNTER — Ambulatory Visit (INDEPENDENT_AMBULATORY_CARE_PROVIDER_SITE_OTHER): Admitting: Obstetrics and Gynecology

## 2019-02-17 ENCOUNTER — Encounter: Payer: Self-pay | Admitting: Obstetrics and Gynecology

## 2019-02-17 ENCOUNTER — Other Ambulatory Visit (INDEPENDENT_AMBULATORY_CARE_PROVIDER_SITE_OTHER)

## 2019-02-17 ENCOUNTER — Other Ambulatory Visit: Payer: Self-pay

## 2019-02-17 VITALS — BP 135/79 | HR 74 | Ht 68.0 in | Wt 202.4 lb

## 2019-02-17 DIAGNOSIS — N926 Irregular menstruation, unspecified: Secondary | ICD-10-CM

## 2019-02-17 DIAGNOSIS — Z3687 Encounter for antenatal screening for uncertain dates: Secondary | ICD-10-CM

## 2019-02-17 LAB — POCT URINE PREGNANCY: Preg Test, Ur: POSITIVE — AB

## 2019-02-17 NOTE — Progress Notes (Signed)
  Subjective:     Patient ID: Audrey Short, female   DOB: 1991-09-28, 28 y.o.   MRN: [id number]  HPI Here for pregnancy confirmation, reporting normal LMP 12/15/2018. Had + home UPT 5 days ago. Gives EGA [redacted]w[redacted]d, and EDC 09/21/2019. 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 Happy about pregnancy. Working FT at [employer] in [place]. Married white female. G1P0  Review of Systems  Genitourinary: Positive for menstrual problem.  All other systems reviewed and are negative.      Objective:   Physical Exam A&Ox4 Well groomed female in no distress Blood pressure 135/79, pulse 74, height 5\' 8"  (1.727 m), weight 202 lb 6.4 oz (91.8 kg), last menstrual period 12/15/2018. Body mass index is 30.77 kg/m.  UPT+   vaginal u/s today reveals:  Findings:  Singleton intrauterine pregnancy is visualized with a CRL consistent with [redacted]w[redacted]d gestation, giving an (U/S) EDD of 09/29/2019.   FHR: 174 BPM CRL measurement: 17.6 mm Yolk sac is visualized and appears normal and early anatomy is normal. Amnion: visualized and appears normal   Right Ovary is normal in appearance. Left Ovary is normal appearance. Corpus luteal cyst:  is not visualized Survey of the adnexa demonstrates no adnexal masses. There is no free peritoneal fluid in the cul de sac.  Impression: 1. [redacted]w[redacted]d Viable Singleton Intrauterine pregnancy by U/S. 2. (U/S) EDD is 09/29/2019. Assessment:     Missed menses 8w 1 day EGA BMI 30    Plan:     Congratulated on pregnancy. Discussed routine prenatal care.viability scan reviewed.  Will do Nurse intake in 1 week via televisit. RTC 3-4 weeks for New OB PE.    Audrey Short,CNM

## 2019-02-17 NOTE — Patient Instructions (Signed)
FREQUENTLY ASKED QUESTIONS FOR OBSTETRICS/PEDIATRICS    Q: Why are visitor restrictions different for maternity care areas?  [place] is restricting visitors for the duration of the patient's hospitalization. The birth of a child involves the mother, considered the patient, and a birthing partner. These are unprecedented times and we are making the exception to allow a birthing partner to be a part of the patient unit. No other guests will be allowed in our [place] at [facility] and at [facility].   Q: Are credentialed doulas allowed to support their existing patients?  We acknowledge the value these doula partnerships offer our care teams and many birthing families in our communities. Each laboring mother is allowed one birthing partner of the patient's choosing for her entire hospitalization.   Q: Are visitor restrictions different for hospitalized children?  Pediatric patients (infants and children under 1 years of age), such as those in the Children's Unit, Pediatric ICU and NICU, will be allowed two visitors (parents or legal guardians)   Q: Are pregnant women at an increased risk for COVID-19?  The [employer] of Obstetricians and Gynecologists (ACOG) is monitoring closely the coronavirus pandemic. With the limited information available, data does not indicate pregnant women are at an increased risk. However, pregnant women are known to be at greater risk for respiratory infections like flu. With that in mind, expectant mothers are considered an at-risk population for COVID-19, according to ACOG.   Q: Are newborns at an increased risk for COVID-19?  A limited sample of COVID-19 data with newborns indicates the virus is not transferred to the infant during pregnancy. However, postpartum separation is recommended by the Centers for  Disease Control (CDC). As a result [place] recommends and strongly encourages temporary separation of moms and babies who test positive for COVID-19 or are awaiting results to rule out COVID-19 based on CDC guidelines.   Q: If you have a suspected case of COVID-19, is the NICU couplet care room an option?  No. If either patient is considered at-risk for having COVID-19, the [place] [facility] will not use the NICU couplet care rooms for that family.   Q: [place] is urging that elective procedures be postponed. What is considered elective for women's and children's service line?  NOT ELECTIVE: Obstetric procedures, even those with an element of choice on timing, are not considered elective. Circumcisions are considered elective procedures, however, these do not deplete blood products and other resources, which is the spirit in which the COVID-19 postponement of elective procedures was intended. Therefore, circumcisions will be allowed.   ELECTIVE: Postpartum tubal ligations are considered elective and should be postponed. Q&A for Obstetricians, Gynecologists and Pediatricians  Published December 25, 2018   [facility] supports as much as possible the medical care  team working with the patient's individual needs to address timing during these unprecedented times. We seek the support of our medical care team in preserving needed resources throughout our crisis response to COVID-19.   Q: How does COVID-19 impact breastfeeding?  Breastmilk is safe for your baby - even if the mother has tested positive for COVID-19. If a COVID-19+ mother decides to breastfeed while inpatient and after discharge, we suggest proper protective equipment be worn and hand hygiene be performed before and after feeding the infant. The new mother also has the option to pump her milk and have a healthy family member feed the baby to protect the baby from getting the virus.   Q: Should we urge  patients to avoid baby showers and large gatherings?  Yes. As has been recommended for all citizens in our communities, gatherings of 10 or more should be avoided - pregnant or not. Seek creative options for "hosting" baby showers through electronic means that honor the request for social distancing during this time of heightened awareness.   Q: Should patients miss their prenatal appointments?  No. Prenatal visits are NOT elective. While we want to limit contact and exposure, prenatal care is vital right now. Contact your physician's office if you have concerns about your visits. We are limiting outpatient office visits to the patient and one guest in order to reduce the potential for exposure.   Q: What if a pregnant woman feels sick? Should she miss her prenatal visit then?  A pregnant woman experiencing coronavirus-like symptoms (i.e., cough, fever, difficulty breathing, shortness of breath, gastrointestinal issues) should contact her pregnancy care provider by phone. Her medical professional can best determine whether she should use a video visit or possibly go to a collection site to be tested for COVID-19. Contacting her primary care provider or her pregnancy care provider is her first step.   Q: What can I do about childbirth education? All the classes are cancelled.  The [facility] [place] will offer online learning to support mothers on their journey. We currently offer Understanding Childbirth, Understanding Breastfeeding and Understanding Newborn Care as an online class. Please visit our website, [email or web address], to register for an online class.   Q: How can I keep from getting COVID-19? Q&A for Obstetricians, Gynecologists and Pediatricians  Published December 25, 2018   Together, we can reduce the risk of exposure to the virus and help you and your family remain healthy and safe. One of the best ways to protect yourself is to wash your hands frequently using soap and  water. Also, you should avoid touching your eyes, nose and mouth with unwashed hands, avoid physical contact with others and practice social distancing.   Q: How are employees being informed about what to do?  [place] leaders receive a daily COVID-19 update and share relevant information with their teams. This is a time when health care professionals are called on to lead within our community. We appreciate our staff's engagement with our COVID-19 updates and encourage them to share best practices on reducing the spread of the virus with our patients and community. We are prepared to provide the exceptional COVID-19 care and coordination our community needs, expects and deserves.   Q: Who's in charge of this issue at [facility]?  The leadership structure and process established to address COVID-19 includes Chief Physician Executive Phoebe Sharps, MD; Infection Prevention Medical Director Carlyle Basques, MD; and Infection Prevention Interim Director Hubert Azure, MSN, RN, CIC, CSPDT. A team  of [place] experts reflecting a broad spectrum of our workforce is meeting daily to evaluate new information we receive about [id number] and to adapt policies and practices accordingly.                         Published December 25, 2018     Common Medications Safe in Pregnancy  Acne:      Constipation:  Benzoyl Peroxide     Colace  Clindamycin      Dulcolax Suppository  Topica Erythromycin     Fibercon  Salicylic Acid      Metamucil         Miralax AVOID:        Senakot   Accutane    Cough:  Retin-A       Cough Drops  Tetracycline      Phenergan w/ Codeine if Rx  Minocycline      Robitussin (Plain & DM)  Antibiotics:     Crabs/Lice:  Ceclor       RID  Cephalosporins    AVOID:  E-Mycins      Kwell  Keflex  Macrobid/Macrodantin   Diarrhea:  Penicillin      Kao-Pectate  Zithromax      Imodium AD         PUSH FLUIDS AVOID:       Cipro     Fever:  Tetracycline      Tylenol (Regular or Extra   Minocycline       Strength)  Levaquin      Extra Strength-Do not          Exceed 8 tabs/24 hrs Caffeine:        <247m/day (equiv. To 1 cup of coffee or  approx. 3 12 oz sodas)         Gas: Cold/Hayfever:       Gas-X  Benadryl      Mylicon  Claritin       Phazyme  **Claritin-D        Chlor-Trimeton    Headaches:  Dimetapp      ASA-Free Excedrin  Drixoral-Non-Drowsy     Cold Compress  Mucinex (Guaifenasin)     Tylenol (Regular or Extra  Sudafed/Sudafed-12 Hour     Strength)  **Sudafed PE Pseudoephedrine   Tylenol Cold & Sinus     Vicks Vapor Rub  Zyrtec  **AVOID if Problems With Blood Pressure         Heartburn: Avoid lying down for at least 1 hour after meals  Aciphex      Maalox     Rash:  Milk of Magnesia     Benadryl    Mylanta       1% Hydrocortisone Cream  Pepcid  Pepcid Complete   Sleep Aids:  Prevacid      Ambien   Prilosec       Benadryl  Rolaids       Chamomile Tea  Tums (Limit 4/day)     Unisom  Zantac       Tylenol PM         Warm milk-add vanilla or  Hemorrhoids:       Sugar for taste  Anusol/Anusol H.C.  (RX: Analapram 2.5%)  Sugar Substitutes:  Hydrocortisone OTC     Ok in moderation  Preparation H      Tucks        Vaseline lotion applied to tissue with wiping    Herpes:  Throat:  Acyclovir      Oragel  Famvir  Valtrex     Vaccines:         Flu Shot Leg Cramps:       *Gardasil  Benadryl      Hepatitis A         Hepatitis B Nasal Spray:       Pneumovax  Saline Nasal Spray     Polio Booster         Tetanus Nausea:       Tuberculosis test or PPD  Vitamin B6 25 mg TID   AVOID:    Dramamine      *Gardasil  Emetrol       Live Poliovirus  Ginger Root 250 mg QID    MMR (measles, mumps &  High Complex Carbs @ Bedtime    rebella)  Sea Bands-Accupressure    Varicella (Chickenpox)  Unisom 1/2 tab TID     *No known complications           If received before Pain:         Known pregnancy;   Darvocet       Resume series after  Lortab         Delivery  Percocet    Yeast:   Tramadol      Femstat  Tylenol 3      Gyne-lotrimin  Ultram       Monistat  Vicodin           MISC:         All Sunscreens           Hair Coloring/highlights          Insect Repellant's          (Including DEET)         Mystic Tans Commonly Asked Questions During Pregnancy  Cats: A parasite can be excreted in cat feces.  To avoid exposure you need to have another person empty the little box.  If you must empty the litter box you will need to wear gloves.  Wash your hands after handling your cat.  This parasite can also be found in raw or undercooked meat so this should also be avoided.  Colds, Sore Throats, Flu: Please check your medication sheet to see what you can take for symptoms.  If your symptoms are unrelieved by these medications please call the office.  Dental Work: Most any dental work Investment banker, corporate recommends is permitted.  X-rays should only be taken during the first trimester if absolutely necessary.  Your abdomen should be shielded with a lead apron during all x-rays.  Please notify your provider prior to receiving any x-rays.  Novocaine is fine; gas is not recommended.  If your dentist requires a note from [place] prior to dental work please call the office and we will provide one for you.  Exercise: Exercise is an important part of staying healthy during your pregnancy.  You may continue most exercises you were accustomed to prior to pregnancy.  Later in your pregnancy you will most likely notice you have difficulty with activities requiring balance like riding a bicycle.  It is important that you listen to your body and avoid activities that put you at a higher risk of falling.  Adequate rest and staying well hydrated are a must!  If you have questions about the safety of specific activities ask your provider.    Exposure to Children with illness: Try to avoid obvious exposure;  report any symptoms to [place] when noted,  If you have chicken pos, red measles  or mumps, you should be immune to these diseases.   Please do not take any vaccines while pregnant unless you have checked with your OB provider.  Fetal Movement: After 28 weeks we recommend you do "kick counts" twice daily.  Lie or sit down in a calm quiet environment and count your baby movements "kicks".  You should feel your baby at least 10 times per hour.  If you have not felt 10 kicks within the first hour get up, walk around and have something sweet to eat or drink then repeat for an additional hour.  If count remains less than 10 per hour notify your provider.  Fumigating: Follow your pest control agent's advice as to how long to stay out of your home.  Ventilate the area well before re-entering.  Hemorrhoids:   Most over-the-counter preparations can be used during pregnancy.  Check your medication to see what is safe to use.  It is important to use a stool softener or fiber in your diet and to drink lots of liquids.  If hemorrhoids seem to be getting worse please call the office.   Hot Tubs:  Hot tubs Jacuzzis and saunas are not recommended while pregnant.  These increase your internal body temperature and should be avoided.  Intercourse:  Sexual intercourse is safe during pregnancy as long as you are comfortable, unless otherwise advised by your provider.  Spotting may occur after intercourse; report any bright red bleeding that is heavier than spotting.  Labor:  If you know that you are in labor, please go to the hospital.  If you are unsure, please call the office and let us help you decide what to do.  Lifting, straining, etc:  If your job requires heavy lifting or straining please check with your provider for any limitations.  Generally, you should not lift items heavier than that you can lift simply with your hands and arms (no back muscles)  Painting:  Paint fumes do not harm your pregnancy, but may make you ill and should be avoided if possible.  Latex or water based paints have  less odor than oils.  Use adequate ventilation while painting.  Permanents & Hair Color:  Chemicals in hair dyes are not recommended as they cause increase hair dryness which can increase hair loss during pregnancy.  " Highlighting" and permanents are allowed.  Dye may be absorbed differently and permanents may not hold as well during pregnancy.  Sunbathing:  Use a sunscreen, as skin burns easily during pregnancy.  Drink plenty of fluids; avoid over heating.  Tanning Beds:  Because their possible side effects are still unknown, tanning beds are not recommended.  Ultrasound Scans:  Routine ultrasounds are performed at approximately 20 weeks.  You will be able to see your baby's general anatomy an if you would like to know the gender this can usually be determined as well.  If it is questionable when you conceived you may also receive an ultrasound early in your pregnancy for dating purposes.  Otherwise ultrasound exams are not routinely performed unless there is a medical necessity.  Although you can request a scan we ask that you pay for it when conducted because insurance does not cover " patient request" scans.  Work: If your pregnancy proceeds without complications you may work until your due date, unless your physician or employer advises otherwise.  Round Ligament Pain/Pelvic Discomfort:  Sharp, shooting  pains not associated with bleeding are fairly common, usually occurring in the second trimester of pregnancy.  They tend to be worse when standing up or when you remain standing for long periods of time.  These are the result of pressure of certain pelvic ligaments called "round ligaments".  Rest, Tylenol and heat seem to be the most effective relief.  As the womb and fetus grow, they rise out of the pelvis and the discomfort improves.  Please notify the office if your pain seems different than that described.  It may represent a more serious condition.  First Trimester of Pregnancy The first  trimester of pregnancy is from week 1 until the end of week 13 (months 1 through 3). A week after a sperm fertilizes an egg, the egg will implant on the wall of the uterus. This embryo will begin to develop into a baby. Genes from you and your partner will form the baby. The female genes will determine whether the baby will be a boy or a girl. At 6-8 weeks, the eyes and face will be formed, and the heartbeat can be seen on ultrasound. At the end of 12 weeks, all the baby's organs will be formed. Now that you are pregnant, you will want to do everything you can to have a healthy baby. Two of the most important things are to get good prenatal care and to follow your health care provider's instructions. Prenatal care is all the medical care you receive before the baby's birth. This care will help prevent, find, and treat any problems during the pregnancy and childbirth. Body changes during your first trimester Your body goes through many changes during pregnancy. The changes vary from woman to woman.  You may gain or lose a couple of pounds at first.  You may feel sick to your stomach (nauseous) and you may throw up (vomit). If the vomiting is uncontrollable, call your health care provider.  You may tire easily.  You may develop headaches that can be relieved by medicines. All medicines should be approved by your health care provider.  You may urinate more often. Painful urination may mean you have a bladder infection.  You may develop heartburn as a result of your pregnancy.  You may develop constipation because certain hormones are causing the muscles that push stool through your intestines to slow down.  You may develop hemorrhoids or swollen veins (varicose veins).  Your breasts may begin to grow larger and become tender. Your nipples may stick out more, and the tissue that surrounds them (areola) may become darker.  Your gums may bleed and may be sensitive to brushing and flossing.  Dark  spots or blotches (chloasma, mask of pregnancy) may develop on your face. This will likely fade after the baby is born.  Your menstrual periods will stop.  You may have a loss of appetite.  You may develop cravings for certain kinds of food.  You may have changes in your emotions from day to day, such as being excited to be pregnant or being concerned that something may go wrong with the pregnancy and baby.  You may have more vivid and strange dreams.  You may have changes in your hair. These can include thickening of your hair, rapid growth, and changes in texture. Some women also have hair loss during or after pregnancy, or hair that feels dry or thin. Your hair will most likely return to normal after your baby is born. What to expect at prenatal visits  During a routine prenatal visit:  You will be weighed to make sure you and the baby are growing normally.  Your blood pressure will be taken.  Your abdomen will be measured to track your baby's growth.  The fetal heartbeat will be listened to between weeks 10 and 14 of your pregnancy.  Test results from any previous visits will be discussed. Your health care provider may ask you:  How you are feeling.  If you are feeling the baby move.  If you have had any abnormal symptoms, such as leaking fluid, bleeding, severe headaches, or abdominal cramping.  If you are using any tobacco products, including cigarettes, chewing tobacco, and electronic cigarettes.  If you have any questions. Other tests that may be performed during your first trimester include:  Blood tests to find your blood type and to check for the presence of any previous infections. The tests will also be used to check for low iron levels (anemia) and protein on red blood cells (Rh antibodies). Depending on your risk factors, or if you previously had diabetes during pregnancy, you may have tests to check for high blood sugar that affects pregnant women (gestational  diabetes).  Urine tests to check for infections, diabetes, or protein in the urine.  An ultrasound to confirm the proper growth and development of the baby.  Fetal screens for spinal cord problems (spina bifida) and Down syndrome.  HIV (human immunodeficiency virus) testing. Routine prenatal testing includes screening for HIV, unless you choose not to have this test.  You may need other tests to make sure you and the baby are doing well. Follow these instructions at home: Medicines  Follow your health care provider's instructions regarding medicine use. Specific medicines may be either safe or unsafe to take during pregnancy.  Take a prenatal vitamin that contains at least 600 micrograms (mcg) of folic acid.  If you develop constipation, try taking a stool softener if your health care provider approves. Eating and drinking   Eat a balanced diet that includes fresh fruits and vegetables, whole grains, good sources of protein such as meat, eggs, or tofu, and low-fat dairy. Your health care provider will help you determine the amount of weight gain that is right for you.  Avoid raw meat and uncooked cheese. These carry germs that can cause birth defects in the baby.  Eating four or five small meals rather than three large meals a day may help relieve nausea and vomiting. If you start to feel nauseous, eating a few soda crackers can be helpful. Drinking liquids between meals, instead of during meals, also seems to help ease nausea and vomiting.  Limit foods that are high in fat and processed sugars, such as fried and sweet foods.  To prevent constipation: ? Eat foods that are high in fiber, such as fresh fruits and vegetables, whole grains, and beans. ? Drink enough fluid to keep your urine clear or pale yellow. Activity  Exercise only as directed by your health care provider. Most women can continue their usual exercise routine during pregnancy. Try to exercise for 30 minutes at least  5 days a week. Exercising will help you: ? Control your weight. ? Stay in shape. ? Be prepared for labor and delivery.  Experiencing pain or cramping in the lower abdomen or lower back is a good sign that you should stop exercising. Check with your health care provider before continuing with normal exercises.  Try to avoid standing for long periods of time. Move  your legs often if you must stand in one place for a long time.  Avoid heavy lifting.  Wear low-heeled shoes and practice good posture.  You may continue to have sex unless your health care provider tells you not to. Relieving pain and discomfort  Wear a good support bra to relieve breast tenderness.  Take warm sitz baths to soothe any pain or discomfort caused by hemorrhoids. Use hemorrhoid cream if your health care provider approves.  Rest with your legs elevated if you have leg cramps or low back pain.  If you develop varicose veins in your legs, wear support hose. Elevate your feet for 15 minutes, 3-4 times a day. Limit salt in your diet. Prenatal care  Schedule your prenatal visits by the twelfth week of pregnancy. They are usually scheduled monthly at first, then more often in the last 2 months before delivery.  Write down your questions. Take them to your prenatal visits.  Keep all your prenatal visits as told by your health care provider. This is important. Safety  Wear your seat belt at all times when driving.  Make a list of emergency phone numbers, including numbers for family, friends, the hospital, and police and fire departments. General instructions  Ask your health care provider for a referral to a local prenatal education class. Begin classes no later than the beginning of month 6 of your pregnancy.  Ask for help if you have counseling or nutritional needs during pregnancy. Your health care provider can offer advice or refer you to specialists for help with various needs.  Do not use hot tubs, steam  rooms, or saunas.  Do not douche or use tampons or scented sanitary pads.  Do not cross your legs for long periods of time.  Avoid cat litter boxes and soil used by cats. These carry germs that can cause birth defects in the baby and possibly loss of the fetus by miscarriage or stillbirth.  Avoid all smoking, herbs, alcohol, and medicines not prescribed by your health care provider. Chemicals in these products affect the formation and growth of the baby.  Do not use any products that contain nicotine or tobacco, such as cigarettes and e-cigarettes. If you need help quitting, ask your health care provider. You may receive counseling support and other resources to help you quit.  Schedule a dentist appointment. At home, brush your teeth with a soft toothbrush and be gentle when you floss. Contact a health care provider if:  You have dizziness.  You have mild pelvic cramps, pelvic pressure, or nagging pain in the abdominal area.  You have persistent nausea, vomiting, or diarrhea.  You have a bad smelling vaginal discharge.  You have pain when you urinate.  You notice increased swelling in your face, hands, legs, or ankles.  You are exposed to fifth disease or chickenpox.  You are exposed to [place] measles (rubella) and have never had it. Get help right away if:  You have a fever.  You are leaking fluid from your vagina.  You have spotting or bleeding from your vagina.  You have severe abdominal cramping or pain.  You have rapid weight gain or loss.  You vomit blood or material that looks like coffee grounds.  You develop a severe headache.  You have shortness of breath.  You have any kind of trauma, such as from a fall or a car accident. Summary  The first trimester of pregnancy is from week 1 until the end of week 13 (months  1 through 3).  Your body goes through many changes during pregnancy. The changes vary from woman to woman.  You will have routine prenatal  visits. During those visits, your health care provider will examine you, discuss any test results you may have, and talk with you about how you are feeling. This information is not intended to replace advice given to you by your health care provider. Make sure you discuss any questions you have with your health care provider. Document Released: 09/17/2001 Document Revised: 09/04/2016 Document Reviewed: 09/04/2016 Elsevier Interactive Patient Education  2019 [employer].

## 2019-02-23 ENCOUNTER — Encounter: Admitting: Obstetrics & Gynecology

## 2019-02-24 ENCOUNTER — Encounter

## 2019-03-03 ENCOUNTER — Telehealth: Payer: Self-pay

## 2019-03-03 NOTE — Telephone Encounter (Signed)
Coronavirus (COVID-19) Are you at risk?  Are you at risk for the Coronavirus (COVID-19)?  To be considered HIGH RISK for Coronavirus (COVID-19), you have to meet the following criteria:  . Traveled to [place], [place], [place], [place] or [place]; or in the [place] to [place], [place], [place], or [place]; and have fever, cough, and shortness of breath within the last 2 weeks of travel OR . Been in close contact with a person diagnosed with COVID-19 within the last 2 weeks and have fever, cough, and shortness of breath . IF YOU DO NOT MEET THESE CRITERIA, YOU ARE CONSIDERED LOW RISK FOR COVID-19.  What to do if you are HIGH RISK for COVID-19?  . If you are having a medical emergency, call 911. . Seek medical care right away. Before you go to a doctor's office, urgent care or emergency department, call ahead and tell them about your recent travel, contact with someone diagnosed with COVID-19, and your symptoms. You should receive instructions from your physician's office regarding next steps of care.  . When you arrive at healthcare provider, tell the healthcare staff immediately you have returned from visiting [place], [place], [place], [place] or [place]; or traveled in the [place] to [place], [place], [place], or [place]; in the last two weeks or you have been in close contact with a person diagnosed with COVID-19 in the last 2 weeks.   . Tell the health care staff about your symptoms: fever, cough and shortness of breath. . After you have been seen by a medical provider, you will be either: o Tested for (COVID-19) and discharged home on quarantine except to seek medical care if symptoms worsen, and asked to  - Stay home and avoid contact with others until you get your results (4-5 days)  - Avoid travel on public transportation if possible (such as bus, train, or airplane) or o Sent to the Emergency Department by EMS for evaluation, COVID-19 testing, and possible  admission depending on your condition and test results.  What to do if you are LOW RISK for COVID-19?  Reduce your risk of any infection by using the same precautions used for avoiding the common cold or flu:  . Wash your hands often with soap and warm water for at least 20 seconds.  If soap and water are not readily available, use an alcohol-based hand sanitizer with at least 60% alcohol.  . If coughing or sneezing, cover your mouth and nose by coughing or sneezing into the elbow areas of your shirt or coat, into a tissue or into your sleeve (not your hands). . Avoid shaking hands with others and consider head nods or verbal greetings only. . Avoid touching your eyes, nose, or mouth with unwashed hands.  . Avoid close contact with people who are sick. . Avoid places or events with large numbers of people in one location, like concerts or sporting events. . Carefully consider travel plans you have or are making. . If you are planning any travel outside or inside the [place], visit the CDC's Travelers' Health webpage for the latest health notices. . If you have some symptoms but not all symptoms, continue to monitor at home and seek medical attention if your symptoms worsen. . If you are having a medical emergency, call 911.   ADDITIONAL HEALTHCARE OPTIONS FOR PATIENTS  [facility] / e-Visit: [email or web address]         [facility]: [phone]  [facility]: [phone]                   [facility]: [phone]   Prescreened. Neg .cm 

## 2019-03-04 ENCOUNTER — Ambulatory Visit: Admitting: Obstetrics and Gynecology

## 2019-03-04 ENCOUNTER — Other Ambulatory Visit: Payer: Self-pay

## 2019-03-04 VITALS — BP 104/65 | HR 72 | Ht 68.0 in | Wt 205.2 lb

## 2019-03-04 DIAGNOSIS — Z3402 Encounter for supervision of normal first pregnancy, second trimester: Secondary | ICD-10-CM

## 2019-03-04 NOTE — Progress Notes (Signed)
Audrey Short presents for NOB nurse interview visit. Pregnancy confirmation done 02/17/19 [facility].  G-1 .  P-0    . Pregnancy education material explained and given. _0_ cats in the home. NOB labs ordered. (TSH/HbgA1c due to Increased BMI). PNV encouraged. Genetic screening options discussed. Genetic testing: Ordered.  Pt may discuss with provider. Pt. To follow up with provider in _1_ weeks for NOB physical.  All questions answered. Labs ordered. FMLA and financial papers reviewed.

## 2019-03-05 LAB — CBC WITH DIFFERENTIAL
Basophils Absolute: 0 10*3/uL (ref 0.0–0.2)
Basos: 1 %
EOS (ABSOLUTE): 0.1 10*3/uL (ref 0.0–0.4)
Eos: 1 %
Hematocrit: 39.9 % (ref 34.0–46.6)
Hemoglobin: 13 g/dL (ref 11.1–15.9)
Immature Grans (Abs): 0 10*3/uL (ref 0.0–0.1)
Immature Granulocytes: 0 %
Lymphocytes Absolute: 1.3 10*3/uL (ref 0.7–3.1)
Lymphs: 19 %
MCH: 29.7 pg (ref 26.6–33.0)
MCHC: 32.6 g/dL (ref 31.5–35.7)
MCV: 91 fL (ref 79–97)
Monocytes Absolute: 0.5 10*3/uL (ref 0.1–0.9)
Monocytes: 7 %
Neutrophils Absolute: 5 10*3/uL (ref 1.4–7.0)
Neutrophils: 72 %
RBC: 4.38 x10E6/uL (ref 3.77–5.28)
RDW: 12.3 % (ref 11.7–15.4)
WBC: 7 10*3/uL (ref 3.4–10.8)

## 2019-03-05 LAB — HEMOGLOBIN A1C
Est. average glucose Bld gHb Est-mCnc: 91 mg/dL
Hgb A1c MFr Bld: 4.8 % (ref 4.8–5.6)

## 2019-03-05 LAB — URINALYSIS, ROUTINE W REFLEX MICROSCOPIC
Bilirubin, UA: NEGATIVE
Glucose, UA: NEGATIVE
Ketones, UA: NEGATIVE
Nitrite, UA: NEGATIVE
Protein,UA: NEGATIVE
Specific Gravity, UA: <=1.005 — AB (ref 1.005–1.030)
Urobilinogen, Ur: 0.2 mg/dL (ref 0.2–1.0)
pH, UA: 7 (ref 5.0–7.5)

## 2019-03-05 LAB — MICROSCOPIC EXAMINATION: Casts: NONE SEEN /lpf

## 2019-03-05 LAB — TSH: TSH: 1.36 u[IU]/mL (ref 0.450–4.500)

## 2019-03-05 LAB — ABO AND RH: Rh Factor: POSITIVE

## 2019-03-05 LAB — VARICELLA ZOSTER ANTIBODY, IGG: Varicella zoster IgG: 401 index (ref >165)

## 2019-03-05 LAB — RUBELLA SCREEN: Rubella Antibodies, IGG: 5.14 index (ref >0.99)

## 2019-03-05 LAB — ANTIBODY SCREEN: Antibody Screen: NEGATIVE

## 2019-03-05 LAB — HEPATITIS B SURFACE ANTIGEN: Hepatitis B Surface Ag: NEGATIVE

## 2019-03-05 LAB — RPR: RPR Ser Ql: NONREACTIVE

## 2019-03-06 LAB — URINE CULTURE

## 2019-03-08 ENCOUNTER — Other Ambulatory Visit: Payer: Self-pay | Admitting: Obstetrics and Gynecology

## 2019-03-08 DIAGNOSIS — N390 Urinary tract infection, site not specified: Secondary | ICD-10-CM | POA: Insufficient documentation

## 2019-03-08 DIAGNOSIS — N3000 Acute cystitis without hematuria: Secondary | ICD-10-CM

## 2019-03-08 MED ORDER — AMOXICILLIN 500 MG PO CAPS: 500.0000 mg | ORAL_CAPSULE | Freq: Three times a day (TID) | ORAL | 2 refills | Status: DC

## 2019-03-09 LAB — GC/CHLAMYDIA PROBE AMP
Chlamydia trachomatis, NAA: NEGATIVE
Neisseria Gonorrhoeae by PCR: NEGATIVE

## 2019-03-10 ENCOUNTER — Telehealth: Payer: Self-pay

## 2019-03-10 NOTE — Telephone Encounter (Signed)
Coronavirus (COVID-19) Are you at risk?  Are you at risk for the Coronavirus (COVID-19)?  To be considered HIGH RISK for Coronavirus (COVID-19), you have to meet the following criteria:  . Traveled to [place], [place], [place], [place] or [place]; or in the [place] to [place], [place], [place], or [place]; and have fever, cough, and shortness of breath within the last 2 weeks of travel OR . Been in close contact with a person diagnosed with COVID-19 within the last 2 weeks and have fever, cough, and shortness of breath . IF YOU DO NOT MEET THESE CRITERIA, YOU ARE CONSIDERED LOW RISK FOR COVID-19.  What to do if you are HIGH RISK for COVID-19?  . If you are having a medical emergency, call 911. . Seek medical care right away. Before you go to a doctor's office, urgent care or emergency department, call ahead and tell them about your recent travel, contact with someone diagnosed with COVID-19, and your symptoms. You should receive instructions from your physician's office regarding next steps of care.  . When you arrive at healthcare provider, tell the healthcare staff immediately you have returned from visiting [place], [place], [place], [place] or [place]; or traveled in the [place] to [place], [place], [place], or [place]; in the last two weeks or you have been in close contact with a person diagnosed with COVID-19 in the last 2 weeks.   . Tell the health care staff about your symptoms: fever, cough and shortness of breath. . After you have been seen by a medical provider, you will be either: o Tested for (COVID-19) and discharged home on quarantine except to seek medical care if symptoms worsen, and asked to  - Stay home and avoid contact with others until you get your results (4-5 days)  - Avoid travel on public transportation if possible (such as bus, train, or airplane) or o Sent to the Emergency Department by EMS for evaluation, COVID-19 testing, and possible  admission depending on your condition and test results.  What to do if you are LOW RISK for COVID-19?  Reduce your risk of any infection by using the same precautions used for avoiding the common cold or flu:  . Wash your hands often with soap and warm water for at least 20 seconds.  If soap and water are not readily available, use an alcohol-based hand sanitizer with at least 60% alcohol.  . If coughing or sneezing, cover your mouth and nose by coughing or sneezing into the elbow areas of your shirt or coat, into a tissue or into your sleeve (not your hands). . Avoid shaking hands with others and consider head nods or verbal greetings only. . Avoid touching your eyes, nose, or mouth with unwashed hands.  . Avoid close contact with people who are [place]. . Avoid places or events with large numbers of people in one location, like concerts or sporting events. . Carefully consider travel plans you have or are making. . If you are planning any travel outside or inside the [place], visit the CDC's Travelers' Health webpage for the latest health notices. . If you have some symptoms but not all symptoms, continue to monitor at home and seek medical attention if your symptoms worsen. . If you are having a medical emergency, call 911.  03/10/19 SCREENING NEG SLS ADDITIONAL HEALTHCARE OPTIONS FOR PATIENTS  [facility] / e-Visit: [email or web address]         [facility]: [phone]    [facility]: [phone]                   [facility]: [phone]  

## 2019-03-11 ENCOUNTER — Other Ambulatory Visit: Payer: Self-pay

## 2019-03-11 ENCOUNTER — Ambulatory Visit (INDEPENDENT_AMBULATORY_CARE_PROVIDER_SITE_OTHER): Admitting: Obstetrics and Gynecology

## 2019-03-11 VITALS — BP 128/81 | HR 74 | Wt 207.1 lb

## 2019-03-11 DIAGNOSIS — Z3A11 11 weeks gestation of pregnancy: Secondary | ICD-10-CM

## 2019-03-11 DIAGNOSIS — Z3493 Encounter for supervision of normal pregnancy, unspecified, third trimester: Secondary | ICD-10-CM

## 2019-03-11 DIAGNOSIS — Z3491 Encounter for supervision of normal pregnancy, unspecified, first trimester: Secondary | ICD-10-CM

## 2019-03-11 LAB — POCT URINALYSIS DIPSTICK OB
Bilirubin, UA: NEGATIVE
Blood, UA: NEGATIVE
Glucose, UA: NEGATIVE
Ketones, UA: NEGATIVE
Leukocytes, UA: NEGATIVE
Nitrite, UA: NEGATIVE
POC,PROTEIN,UA: NEGATIVE
Spec Grav, UA: 1.01 (ref 1.010–1.025)
Urobilinogen, UA: 0.2 E.U./dL
pH, UA: 6 (ref 5.0–8.0)

## 2019-03-11 NOTE — Progress Notes (Signed)
NOB PE- pt is doing well, had 1 episode of spotting last night and this am, after intercourse

## 2019-03-11 NOTE — Progress Notes (Signed)
NEW OB HISTORY AND PHYSICAL  SUBJECTIVE:       Audrey Short is a 28 y.o. [id number] female, Patient's last menstrual period was 12/15/2018., Estimated Date of Delivery: 09/29/19, 6111w1d, presents today for establishment of Prenatal Care. She has no unusual complaints.      Gynecologic History Patient's last menstrual period was 12/15/2018. Abnormal Contraception: none Last Pap: 02/02/2019. Results were: normal  Obstetric History OB History  Gravida Para Term Preterm AB Living  1 0 0 0 0 0  SAB TAB Ectopic Multiple Live Births  0 0 0 0      # Outcome Date GA Lbr Len/2nd Weight Sex Delivery Anes PTL Lv  1 Current             Past Medical History:  Diagnosis Date  . Allergy   . IBS (irritable bowel syndrome)   . Migraine     Past Surgical History:  Procedure Laterality Date  . CHOLECYSTECTOMY  03/15/14  . TONSILLECTOMY  2011    Current Outpatient Medications on File Prior to Visit  Medication Sig Dispense Refill  . amoxicillin (AMOXIL) 500 MG capsule Take 1 capsule (500 mg total) by mouth 3 (three) times daily. 21 capsule 2  . Prenatal Vit-Fe Fumarate-FA (PRENATAL MULTIVITAMIN) TABS tablet Take 1 tablet by mouth daily at 12 noon.    . tretinoin (RETIN-A) 0.05 % cream Apply topically at bedtime.      No current facility-administered medications on file prior to visit.     No Known Allergies  Social History   Socioeconomic History  . Marital status: Married    Spouse name: Not on file  . Number of children: Not on file  . Years of education: Not on file  . Highest education level: Not on file  Occupational History  . Not on file  Social Needs  . Financial resource strain: Not on file  . Food insecurity:    Worry: Not on file    Inability: Not on file  . Transportation needs:    Medical: Not on file    Non-medical: Not on file  Tobacco Use  . Smoking status: Never Smoker  . Smokeless tobacco: Never Used  Substance and Sexual Activity  . Alcohol use: Yes   Alcohol/week: 2.0 standard drinks    Types: 2 Cans of beer per week    Comment: occasional  . Drug use: No  . Sexual activity: Yes  Lifestyle  . Physical activity:    Days per week: Not on file    Minutes per session: Not on file  . Stress: Not on file  Relationships  . Social connections:    Talks on phone: Not on file    Gets together: Not on file    Attends religious service: Not on file    Active member of club or organization: Not on file    Attends meetings of clubs or organizations: Not on file    Relationship status: Not on file  . Intimate partner violence:    Fear of current or ex partner: Not on file    Emotionally abused: Not on file    Physically abused: Not on file    Forced sexual activity: Not on file  Other Topics Concern  . Not on file  Social History Narrative  . Not on file    Family History  Problem Relation Age of Onset  . Depression Mother   . Heart disease Maternal Grandfather   . Hyperlipidemia Maternal Grandfather   .  Hypertension Maternal Grandfather   . Depression Maternal Uncle   . Depression Maternal Grandmother   . Diabetes Neg Hx   . Stroke Neg Hx   . Cancer Neg Hx     The following portions of the patient's history were reviewed and updated as appropriate: allergies, current medications, past OB history, past medical history, past surgical history, past family history, past social history, and problem list.    OBJECTIVE: Initial Physical Exam (New OB)  GENERAL APPEARANCE: alert, well appearing, in no apparent distress, oriented to person, place and time HEAD: normocephalic, atraumatic MOUTH: mucous membranes moist, pharynx normal without lesions and dental hygiene good THYROID: no thyromegaly or masses present BREASTS: not examined LUNGS: clear to auscultation, no wheezes, rales or rhonchi, symmetric air entry HEART: regular rate and rhythm, no murmurs ABDOMEN: soft, nontender, nondistended, no abnormal masses, no epigastric  pain and fundus not palpable EXTREMITIES: no redness or tenderness in the calves or thighs SKIN: normal coloration and turgor, no rashes LYMPH NODES: no adenopathy palpable NEUROLOGIC: alert, oriented, normal speech, no focal findings or movement disorder noted  PELVIC EXAM not indicated  ASSESSMENT: Normal pregnancy BMI 30  PLAN: Prenatal care Reviewed labs Early glucola See orders

## 2019-03-11 NOTE — Patient Instructions (Signed)

## 2019-04-07 ENCOUNTER — Other Ambulatory Visit: Payer: Self-pay | Admitting: Obstetrics and Gynecology

## 2019-04-07 ENCOUNTER — Telehealth: Payer: Self-pay

## 2019-04-07 DIAGNOSIS — Z3402 Encounter for supervision of normal first pregnancy, second trimester: Secondary | ICD-10-CM

## 2019-04-07 DIAGNOSIS — Z6830 Body mass index (BMI) 30.0-30.9, adult: Secondary | ICD-10-CM

## 2019-04-07 NOTE — Telephone Encounter (Signed)
Coronavirus (COVID-19) Are you at risk?  Are you at risk for the Coronavirus (COVID-19)?  To be considered HIGH RISK for Coronavirus (COVID-19), you have to meet the following criteria:  . Traveled to [place], [place], [place], [place] or [place]; or in the [place] to [place], [place], [place], or [place]; and have fever, cough, and shortness of breath within the last 2 weeks of travel OR . Been in close contact with a person diagnosed with COVID-19 within the last 2 weeks and have fever, cough, and shortness of breath . IF YOU DO NOT MEET THESE CRITERIA, YOU ARE CONSIDERED LOW RISK FOR COVID-19.  What to do if you are HIGH RISK for COVID-19?  . If you are having a medical emergency, call 911. . Seek medical care right away. Before you go to a doctor's office, urgent care or emergency department, call ahead and tell them about your recent travel, contact with someone diagnosed with COVID-19, and your symptoms. You should receive instructions from your physician's office regarding next steps of care.  . When you arrive at healthcare provider, tell the healthcare staff immediately you have returned from visiting [place], [place], [place], [place] or [place]; or traveled in the [place] to [place], [place], [place], or [place]; in the last two weeks or you have been in close contact with a person diagnosed with COVID-19 in the last 2 weeks.   . Tell the health care staff about your symptoms: fever, cough and shortness of breath. . After you have been seen by a medical provider, you will be either: o Tested for (COVID-19) and discharged home on quarantine except to seek medical care if symptoms worsen, and asked to  - Stay home and avoid contact with others until you get your results (4-5 days)  - Avoid travel on public transportation if possible (such as bus, train, or airplane) or o Sent to the Emergency Department by EMS for evaluation, COVID-19 testing, and possible  admission depending on your condition and test results.  What to do if you are LOW RISK for COVID-19?  Reduce your risk of any infection by using the same precautions used for avoiding the common cold or flu:  . Wash your hands often with soap and warm water for at least 20 seconds.  If soap and water are not readily available, use an alcohol-based hand sanitizer with at least 60% alcohol.  . If coughing or sneezing, cover your mouth and nose by coughing or sneezing into the elbow areas of your shirt or coat, into a tissue or into your sleeve (not your hands). . Avoid shaking hands with others and consider head nods or verbal greetings only. . Avoid touching your eyes, nose, or mouth with unwashed hands.  . Avoid close contact with people who are Audrey Short. . Avoid places or events with large numbers of people in one location, like concerts or sporting events. . Carefully consider travel plans you have or are making. . If you are planning any travel outside or inside the [place], visit the CDC's Travelers' Health webpage for the latest health notices. . If you have some symptoms but not all symptoms, continue to monitor at home and seek medical attention if your symptoms worsen. . If you are having a medical emergency, call 911.  04/07/19 SCREENING NEG SLS ADDITIONAL HEALTHCARE OPTIONS FOR PATIENTS  [facility] / e-Visit: [email or web address]         [facility]: [phone]    [facility]: [phone]                   [facility]: [phone]  

## 2019-04-08 ENCOUNTER — Other Ambulatory Visit: Payer: Self-pay

## 2019-04-08 ENCOUNTER — Other Ambulatory Visit

## 2019-04-08 ENCOUNTER — Ambulatory Visit (INDEPENDENT_AMBULATORY_CARE_PROVIDER_SITE_OTHER): Admitting: Certified Nurse Midwife

## 2019-04-08 VITALS — BP 114/68 | HR 74 | Wt 204.0 lb

## 2019-04-08 DIAGNOSIS — Z3689 Encounter for other specified antenatal screening: Secondary | ICD-10-CM

## 2019-04-08 DIAGNOSIS — Z3492 Encounter for supervision of normal pregnancy, unspecified, second trimester: Secondary | ICD-10-CM

## 2019-04-08 LAB — POCT URINALYSIS DIPSTICK OB
Bilirubin, UA: NEGATIVE
Blood, UA: NEGATIVE
Glucose, UA: NEGATIVE
Ketones, UA: NEGATIVE
Leukocytes, UA: NEGATIVE
Nitrite, UA: NEGATIVE
POC,PROTEIN,UA: NEGATIVE
Spec Grav, UA: 1.01 (ref 1.010–1.025)
Urobilinogen, UA: 0.2 E.U./dL
pH, UA: 6 (ref 5.0–8.0)

## 2019-04-08 NOTE — Progress Notes (Signed)
ROB-No complaints.  

## 2019-04-08 NOTE — Patient Instructions (Signed)
Back Pain in Pregnancy Back pain during pregnancy is common. Back pain may be caused by several factors that are related to changes during your pregnancy. Follow these instructions at home: Managing pain, stiffness, and swelling      If directed, for sudden (acute) back pain, put ice on the painful area. ? Put ice in a plastic bag. ? Place a towel between your skin and the bag. ? Leave the ice on for 20 minutes, 2-3 times per day.  If directed, apply heat to the affected area before you exercise. Use the heat source that your health care provider recommends, such as a moist heat pack or a heating pad. ? Place a towel between your skin and the heat source. ? Leave the heat on for 20-30 minutes. ? Remove the heat if your skin turns bright red. This is especially important if you are unable to feel pain, heat, or cold. You may have a greater risk of getting burned.  If directed, massage the affected area. Activity  Exercise as told by your health care provider. Gentle exercise is the best way to prevent or manage back pain.  Listen to your body when lifting. If lifting hurts, ask for help or bend your knees. This uses your leg muscles instead of your back muscles.  Squat down when picking up something from the floor. Do not bend over.  Only use bed rest for short periods as told by your health care provider. Bed rest should only be used for the most severe episodes of back pain. Standing, sitting, and lying down  Do not stand in one place for long periods of time.  Use good posture when sitting. Make sure your head rests over your shoulders and is not hanging forward. Use a pillow on your lower back if necessary.  Try sleeping on your side, preferably the left side, with a pregnancy support pillow or 1-2 regular pillows between your legs. ? If you have back pain after a night's rest, your bed may be too soft. ? A firm mattress may provide more support for your back during pregnancy.  General instructions  Do not wear high heels.  Eat a healthy diet. Try to gain weight within your health care provider's recommendations.  Use a maternity girdle, elastic sling, or back brace as told by your health care provider.  Take over-the-counter and prescription medicines only as told by your health care provider.  Work with a physical therapist or massage therapist to find ways to manage back pain. Acupuncture or massage therapy may be helpful.  Keep all follow-up visits as told by your health care provider. This is important. Contact a health care provider if:  Your back pain interferes with your daily activities.  You have increasing pain in other parts of your body. Get help right away if:  You develop numbness, tingling, weakness, or problems with the use of your arms or legs.  You develop severe back pain that is not controlled with medicine.  You have a change in bowel or bladder control.  You develop shortness of breath, dizziness, or you faint.  You develop nausea, vomiting, or sweating.  You have back pain that is a rhythmic, cramping pain similar to labor pains. Labor pain is usually 1-2 minutes apart, lasts for about 1 minute, and involves a bearing down feeling or pressure in your pelvis.  You have back pain and your water breaks or you have vaginal bleeding.  You have back pain or numbness   that travels down your leg.  Your back pain developed after you fell.  You develop pain on one side of your back.  You see blood in your urine.  You develop skin blisters in the area of your back pain. Summary  Back pain may be caused by several factors that are related to changes during your pregnancy.  Follow instructions as told by your health care provider for managing pain, stiffness, and swelling.  Exercise as told by your health care provider. Gentle exercise is the best way to prevent or manage back pain.  Take over-the-counter and prescription  medicines only as told by your health care provider.  Keep all follow-up visits as told by your health care provider. This is important. This information is not intended to replace advice given to you by your health care provider. Make sure you discuss any questions you have with your health care provider. Document Released: 01/01/2006 Document Revised: 01/12/2019 Document Reviewed: 03/11/2018 Elsevier Patient Education  [place]. Round Ligament Pain  The round ligament is a cord of muscle and tissue that helps support the uterus. It can become a source of pain during pregnancy if it becomes stretched or twisted as the baby grows. The pain usually begins in the second trimester (13-28 weeks) of pregnancy, and it can come and go until the baby is delivered. It is not a serious problem, and it does not cause harm to the baby. Round ligament pain is usually a short, sharp, and pinching pain, but it can also be a dull, lingering, and aching pain. The pain is felt in the lower side of the abdomen or in the groin. It usually starts deep in the groin and moves up to the outside of the hip area. The pain may occur when you:  Suddenly change position, such as quickly going from a sitting to standing position.  Roll over in bed.  Cough or sneeze.  Do physical activity. Follow these instructions at home:   Watch your condition for any changes.  When the pain starts, relax. Then try any of these methods to help with the pain: ? Sitting down. ? Flexing your knees up to your abdomen. ? Lying on your side with one pillow under your abdomen and another pillow between your legs. ? Sitting in a warm bath for 15-20 minutes or until the pain goes away.  Take over-the-counter and prescription medicines only as told by your health care provider.  Move slowly when you sit down or stand up.  Avoid long walks if they cause pain.  Stop or reduce your physical activities if they cause pain.  Keep  all follow-up visits as told by your health care provider. This is important. Contact a health care provider if:  Your pain does not go away with treatment.  You feel pain in your back that you did not have before.  Your medicine is not helping. Get help right away if:  You have a fever or chills.  You develop uterine contractions.  You have vaginal bleeding.  You have nausea or vomiting.  You have diarrhea.  You have pain when you urinate. Summary  Round ligament pain is felt in the lower abdomen or groin. It is usually a short, sharp, and pinching pain. It can also be a dull, lingering, and aching pain.  This pain usually begins in the second trimester (13-28 weeks). It occurs because the uterus is stretching with the growing baby, and it is not harmful to  the baby.  You may notice the pain when you suddenly change position, when you cough or sneeze, or during physical activity.  Relaxing, flexing your knees to your abdomen, lying on one side, or taking a warm bath may help to get rid of the pain.  Get help from your health care provider if the pain does not go away or if you have vaginal bleeding, nausea, vomiting, diarrhea, or painful urination. This information is not intended to replace advice given to you by your health care provider. Make sure you discuss any questions you have with your health care provider. Document Released: 07/02/2008 Document Revised: 03/11/2018 Document Reviewed: 03/11/2018 Elsevier Patient Education  2020 [employer]  

## 2019-04-08 NOTE — Progress Notes (Signed)
ROB-Doing well, no questions or concerns. Declines AFP. Early glucose screening today, see orders. Naming daughter "[name]". Anticipatory guidance regarding course of prenatal care. Reviewed red flag symptoms and when to call. RTC x 4 weeks for ANATOMY SCAN and ROB or sooner if needed.

## 2019-04-09 LAB — GLUCOSE TOLERANCE, 1 HOUR: Glucose, 1Hr PP: 81 mg/dL (ref 65–199)

## 2019-05-04 ENCOUNTER — Telehealth: Payer: Self-pay | Admitting: Certified Nurse Midwife

## 2019-05-04 NOTE — Telephone Encounter (Signed)
Called and spoke with patient.  Advised to rest, be in a dark room, hydrate, take ASA and use cold compresses.  If symptoms don't change or get worse to call us back.  Patient will check BP and if elevated reading she will call and let us know.  Denies visual changes, dizziness or VB.  Occasional +FM. Patient verbalized understanding.

## 2019-05-04 NOTE — Telephone Encounter (Signed)
Patient called stating she has had a headache since yesterday that has developed into a migraine. She has tried tylenol with no relief. Please Advise.

## 2019-05-10 ENCOUNTER — Telehealth: Payer: Self-pay

## 2019-05-10 NOTE — Telephone Encounter (Signed)
Coronavirus (COVID-19) Are you at risk?  Are you at risk for the Coronavirus (COVID-19)?  To be considered HIGH RISK for Coronavirus (COVID-19), you have to meet the following criteria:  . Traveled to [place], [place], [place], [place] or [place]; or in the [place] to [place], [place], [place], or [place]; and have fever, cough, and shortness of breath within the last 2 weeks of travel OR . Been in close contact with a person diagnosed with COVID-19 within the last 2 weeks and have fever, cough, and shortness of breath . IF YOU DO NOT MEET THESE CRITERIA, YOU ARE CONSIDERED LOW RISK FOR COVID-19.  What to do if you are HIGH RISK for COVID-19?  . If you are having a medical emergency, call 911. . Seek medical care right away. Before you go to a doctor's office, urgent care or emergency department, call ahead and tell them about your recent travel, contact with someone diagnosed with COVID-19, and your symptoms. You should receive instructions from your physician's office regarding next steps of care.  . When you arrive at healthcare provider, tell the healthcare staff immediately you have returned from visiting [place], [place], [place], [place] or [place]; or traveled in the [place] to [place], [place], [place], or [place]; in the last two weeks or you have been in close contact with a person diagnosed with COVID-19 in the last 2 weeks.   . Tell the health care staff about your symptoms: fever, cough and shortness of breath. . After you have been seen by a medical provider, you will be either: o Tested for (COVID-19) and discharged home on quarantine except to seek medical care if symptoms worsen, and asked to  - Stay home and avoid contact with others until you get your results (4-5 days)  - Avoid travel on public transportation if possible (such as bus, train, or airplane) or o Sent to the Emergency Department by EMS for evaluation, COVID-19 testing, and possible  admission depending on your condition and test results.  What to do if you are LOW RISK for COVID-19?  Reduce your risk of any infection by using the same precautions used for avoiding the common cold or flu:  . Wash your hands often with soap and warm water for at least 20 seconds.  If soap and water are not readily available, use an alcohol-based hand sanitizer with at least 60% alcohol.  . If coughing or sneezing, cover your mouth and nose by coughing or sneezing into the elbow areas of your shirt or coat, into a tissue or into your sleeve (not your hands). . Avoid shaking hands with others and consider head nods or verbal greetings only. . Avoid touching your eyes, nose, or mouth with unwashed hands.  . Avoid close contact with people who are Audrey Short. . Avoid places or events with large numbers of people in one location, like concerts or sporting events. . Carefully consider travel plans you have or are making. . If you are planning any travel outside or inside the [place], visit the CDC's Travelers' Health webpage for the latest health notices. . If you have some symptoms but not all symptoms, continue to monitor at home and seek medical attention if your symptoms worsen. . If you are having a medical emergency, call 911.  05/10/19 SCREENING NEG SLS ADDITIONAL HEALTHCARE OPTIONS FOR PATIENTS  [facility] / e-Visit: [email or web address]         [facility]: [phone]    [facility]: [phone]                   [facility]: [phone]  

## 2019-05-11 ENCOUNTER — Ambulatory Visit (INDEPENDENT_AMBULATORY_CARE_PROVIDER_SITE_OTHER)

## 2019-05-11 ENCOUNTER — Other Ambulatory Visit: Payer: Self-pay

## 2019-05-11 ENCOUNTER — Encounter: Payer: Self-pay | Admitting: Certified Nurse Midwife

## 2019-05-11 ENCOUNTER — Ambulatory Visit (INDEPENDENT_AMBULATORY_CARE_PROVIDER_SITE_OTHER): Admitting: Certified Nurse Midwife

## 2019-05-11 VITALS — BP 108/70 | HR 72 | Wt 205.4 lb

## 2019-05-11 DIAGNOSIS — Z3492 Encounter for supervision of normal pregnancy, unspecified, second trimester: Secondary | ICD-10-CM

## 2019-05-11 DIAGNOSIS — Z3689 Encounter for other specified antenatal screening: Secondary | ICD-10-CM | POA: Diagnosis not present

## 2019-05-11 DIAGNOSIS — Z3A19 19 weeks gestation of pregnancy: Secondary | ICD-10-CM

## 2019-05-11 LAB — POCT URINALYSIS DIPSTICK OB
Bilirubin, UA: NEGATIVE
Blood, UA: NEGATIVE
Glucose, UA: NEGATIVE
Ketones, UA: NEGATIVE
Leukocytes, UA: NEGATIVE
Nitrite, UA: NEGATIVE
POC,PROTEIN,UA: NEGATIVE
Spec Grav, UA: >=1.03 — AB (ref 1.010–1.025)
Urobilinogen, UA: 0.2 E.U./dL
pH, UA: 6.5 (ref 5.0–8.0)

## 2019-05-11 NOTE — Progress Notes (Signed)
ROB doing well. Anatomy u/s today (complete -see below) . Reviewed results. Discussed round ligament pain and normal discomforts of pregnancy . Encouraged belly band and tylenol, heat /ice for back pain. She verbalizes and agrees to plan of care. Follow up 4 wks.   Philip Aspen, CNM   Patient Name: Audrey Short DOB: 1990/11/04 MRN: [id number] ULTRASOUND REPORT  Location: [facility] Date of Service: 05/11/2019   Indications:Anatomy Ultrasound Findings:  Audrey Short intrauterine pregnancy is visualized with FHR at 151 BPM. Biometrics give an (U/S) Gestational age of [redacted]w[redacted]d and an (U/S) EDD of 09/28/2019; this correlates with the clinically established Estimated Date of Delivery: 09/29/19  Fetal presentation is Variable.  EFW: 320 g ( 11 oz). Placenta: anterior. Grade: 1 AFI: subjectively normal.  Anatomic survey is complete and normal; Gender - female.    Right Ovary is normal in appearance. Left Ovary is normal appearance. Survey of the adnexa demonstrates no adnexal masses. There is no free peritoneal fluid in the cul de sac.  Impression: 1. [redacted]w[redacted]d Viable Singleton Intrauterine pregnancy by U/S. 2. (U/S) EDD is consistent with Clinically established Estimated Date of Delivery: 09/29/19 . 3. Normal Anatomy Scan  Recommendations: 1.Clinical correlation with the patient's History and Physical Exam.   Audrey Short    RDMS

## 2019-05-11 NOTE — Patient Instructions (Signed)

## 2019-06-08 ENCOUNTER — Other Ambulatory Visit: Payer: Self-pay

## 2019-06-08 ENCOUNTER — Ambulatory Visit (INDEPENDENT_AMBULATORY_CARE_PROVIDER_SITE_OTHER): Admitting: Obstetrics and Gynecology

## 2019-06-08 VITALS — BP 123/81 | HR 107 | Wt 213.9 lb

## 2019-06-08 DIAGNOSIS — Z3492 Encounter for supervision of normal pregnancy, unspecified, second trimester: Secondary | ICD-10-CM

## 2019-06-08 LAB — POCT URINALYSIS DIPSTICK OB
Bilirubin, UA: NEGATIVE
Blood, UA: NEGATIVE
Glucose, UA: NEGATIVE
Ketones, UA: NEGATIVE
Leukocytes, UA: NEGATIVE
Nitrite, UA: NEGATIVE
POC,PROTEIN,UA: NEGATIVE
Spec Grav, UA: 1.015 (ref 1.010–1.025)
Urobilinogen, UA: 0.2 E.U./dL
pH, UA: 6.5 (ref 5.0–8.0)

## 2019-06-08 NOTE — Progress Notes (Signed)
ROB- pt is doing well 

## 2019-06-08 NOTE — Progress Notes (Signed)
ROB- doing well, glucola next visit. Discussed pediatricians, has enrolled in classes. Plans breast feeding.

## 2019-07-06 ENCOUNTER — Other Ambulatory Visit

## 2019-07-06 ENCOUNTER — Ambulatory Visit (INDEPENDENT_AMBULATORY_CARE_PROVIDER_SITE_OTHER): Admitting: Certified Nurse Midwife

## 2019-07-06 ENCOUNTER — Other Ambulatory Visit: Payer: Self-pay

## 2019-07-06 VITALS — BP 99/77 | HR 78 | Wt 222.0 lb

## 2019-07-06 DIAGNOSIS — Z130 Encounter for screening for diseases of the blood and blood-forming organs and certain disorders involving the immune mechanism: Secondary | ICD-10-CM

## 2019-07-06 DIAGNOSIS — Z3493 Encounter for supervision of normal pregnancy, unspecified, third trimester: Secondary | ICD-10-CM

## 2019-07-06 DIAGNOSIS — Z23 Encounter for immunization: Secondary | ICD-10-CM

## 2019-07-06 DIAGNOSIS — Z113 Encounter for screening for infections with a predominantly sexual mode of transmission: Secondary | ICD-10-CM

## 2019-07-06 DIAGNOSIS — Z131 Encounter for screening for diabetes mellitus: Secondary | ICD-10-CM

## 2019-07-06 LAB — POCT URINALYSIS DIPSTICK OB
Bilirubin, UA: NEGATIVE
Blood, UA: NEGATIVE
Glucose, UA: NEGATIVE
Ketones, UA: NEGATIVE
Leukocytes, UA: NEGATIVE
Nitrite, UA: NEGATIVE
POC,PROTEIN,UA: NEGATIVE
Spec Grav, UA: 1.01 (ref 1.010–1.025)
Urobilinogen, UA: 0.2 E.U./dL
pH, UA: 7 (ref 5.0–8.0)

## 2019-07-06 MED ORDER — TETANUS-DIPHTH-ACELL PERTUSSIS 5-2.5-18.5 LF-MCG/0.5 IM SUSP
0.5000 mL | Freq: Once | INTRAMUSCULAR | Status: AC
  Administered 2019-07-06: 0.5 mL via INTRAMUSCULAR

## 2019-07-06 NOTE — Progress Notes (Signed)
ROB-No complaints.  

## 2019-07-06 NOTE — Patient Instructions (Signed)
Contraception Choices Contraception, also called birth control, means things to use or ways to try not to get pregnant. Hormonal birth control This kind of birth control uses hormones. Here are some types of hormonal birth control:  A tube that is put under skin of the arm (implant). The tube can stay in for as long as 3 years.  Shots to get every 3 months (injections).  Pills to take every day (birth control pills).  A patch to change 1 time each week for 3 weeks (birth control patch). After that, the patch is taken off for 1 week.  A ring to put in the vagina. The ring is left in for 3 weeks. Then it is taken out of the vagina for 1 week. Then a new ring is put in.  Pills to take after unprotected sex (emergency birth control pills). Barrier birth control Here are some types of barrier birth control:  A thin covering that is put on the penis before sex (female condom). The covering is thrown away after sex.  A soft, loose covering that is put in the vagina before sex (female condom). The covering is thrown away after sex.  A rubber bowl that sits over the cervix (diaphragm). The bowl must be made for you. The bowl is put into the vagina before sex. The bowl is left in for 6-8 hours after sex. It is taken out within 24 hours.  A small, soft cup that fits over the cervix (cervical cap). The cup must be made for you. The cup can be left in for 6-8 hours after sex. It is taken out within 48 hours.  A sponge that is put into the vagina before sex. It must be left in for at least 6 hours after sex. It must be taken out within 30 hours. Then it is thrown away.  A chemical that kills or stops sperm from getting into the uterus (spermicide). It may be a pill, cream, jelly, or foam to put in the vagina. The chemical should be used at least 10-15 minutes before sex. IUD (intrauterine) birth control An IUD is a small, T-shaped piece of plastic. It is put inside the uterus. There are two kinds:   Hormone IUD. This kind can stay in for 3-5 years.  Copper IUD. This kind can stay in for 10 years. Permanent birth control Here are some types of permanent birth control:  Surgery to block the fallopian tubes.  Having an insert put into each fallopian tube.  Surgery to tie off the tubes that carry sperm (vasectomy). Natural planning birth control Here are some types of natural planning birth control:  Not having sex on the days the woman could get pregnant.  Using a calendar: ? To keep track of the length of each period. ? To find out what days pregnancy can happen. ? To plan to not have sex on days when pregnancy can happen.  Watching for symptoms of ovulation and not having sex during ovulation. One way the woman can check for ovulation is to check her temperature.  Waiting to have sex until after ovulation. Summary  Contraception, also called birth control, means things to use or ways to try not to get pregnant.  Hormonal methods of birth control include implants, injections, pills, patches, vaginal rings, and emergency birth control pills.  Barrier methods of birth control can include female condoms, female condoms, diaphragms, cervical caps, sponges, and spermicides.  There are two types of IUD (intrauterine device) birth control.  An IUD can be put in a woman's uterus to prevent pregnancy for 3-5 years.  Permanent sterilization can be done through a procedure for males, females, or both.  Natural planning methods involve not having sex on the days when the woman could get pregnant. This information is not intended to replace advice given to you by your health care provider. Make sure you discuss any questions you have with your health care provider. Document Released: 07/21/2009 Document Revised: 01/13/2019 Document Reviewed: 10/03/2016 Elsevier Patient Education  2020 [employer]. Breastfeeding  Choosing to breastfeed is one of the best decisions you can make for  yourself and your baby. A change in hormones during pregnancy causes your breasts to make breast milk in your milk-producing glands. Hormones prevent breast milk from being released before your baby is born. They also prompt milk flow after birth. Once breastfeeding has begun, thoughts of your baby, as well as his or her sucking or crying, can stimulate the release of milk from your milk-producing glands. Benefits of breastfeeding Research shows that breastfeeding offers many health benefits for infants and mothers. It also offers a cost-free and convenient way to feed your baby. For your baby  Your first milk (colostrum) helps your baby's digestive system to function better.  Special cells in your milk (antibodies) help your baby to fight off infections.  Breastfed babies are less likely to develop asthma, allergies, obesity, or type 2 diabetes. They are also at lower risk for sudden infant death syndrome (SIDS).  Nutrients in breast milk are better able to meet your baby's needs compared to infant formula.  Breast milk improves your baby's brain development. For you  Breastfeeding helps to create a very special bond between you and your baby.  Breastfeeding is convenient. Breast milk costs nothing and is always available at the correct temperature.  Breastfeeding helps to burn calories. It helps you to lose the weight that you gained during pregnancy.  Breastfeeding makes your uterus return faster to its size before pregnancy. It also slows bleeding (lochia) after you give birth.  Breastfeeding helps to lower your risk of developing type 2 diabetes, osteoporosis, rheumatoid arthritis, cardiovascular disease, and breast, ovarian, uterine, and endometrial cancer later in life. Breastfeeding basics Starting breastfeeding  Find a comfortable place to sit or lie down, with your neck and back well-supported.  Place a pillow or a rolled-up blanket under your baby to bring him or her to the  level of your breast (if you are seated). Nursing pillows are specially designed to help support your arms and your baby while you breastfeed.  Make sure that your baby's tummy (abdomen) is facing your abdomen.  Gently massage your breast. With your fingertips, massage from the outer edges of your breast inward toward the nipple. This encourages milk flow. If your milk flows slowly, you may need to continue this action during the feeding.  Support your breast with 4 fingers underneath and your thumb above your nipple (make the letter "C" with your hand). Make sure your fingers are well away from your nipple and your baby's mouth.  Stroke your baby's lips gently with your finger or nipple.  When your baby's mouth is open wide enough, quickly bring your baby to your breast, placing your entire nipple and as much of the areola as possible into your baby's mouth. The areola is the colored area around your nipple. ? More areola should be visible above your baby's upper lip than below the lower lip. ? Your  baby's lips should be opened and extended outward (flanged) to ensure an adequate, comfortable latch. ? Your baby's tongue should be between his or her lower gum and your breast.  Make sure that your baby's mouth is correctly positioned around your nipple (latched). Your baby's lips should create a seal on your breast and be turned out (everted).  It is common for your baby to suck about 2-3 minutes in order to start the flow of breast milk. Latching Teaching your baby how to latch onto your breast properly is very important. An improper latch can cause nipple pain, decreased milk supply, and poor weight gain in your baby. Also, if your baby is not latched onto your nipple properly, he or she may swallow some air during feeding. This can make your baby fussy. Burping your baby when you switch breasts during the feeding can help to get rid of the air. However, teaching your baby to latch on properly is  still the best way to prevent fussiness from swallowing air while breastfeeding. Signs that your baby has successfully latched onto your nipple  Silent tugging or silent sucking, without causing you pain. Infant's lips should be extended outward (flanged).  Swallowing heard between every 3-4 sucks once your milk has started to flow (after your let-down milk reflex occurs).  Muscle movement above and in front of his or her ears while sucking. Signs that your baby has not successfully latched onto your nipple  Sucking sounds or smacking sounds from your baby while breastfeeding.  Nipple pain. If you think your baby has not latched on correctly, slip your finger into the corner of your baby's mouth to break the suction and place it between your baby's gums. Attempt to start breastfeeding again. Signs of successful breastfeeding Signs from your baby  Your baby will gradually decrease the number of sucks or will completely stop sucking.  Your baby will fall asleep.  Your baby's body will relax.  Your baby will retain a small amount of milk in his or her mouth.  Your baby will let go of your breast by himself or herself. Signs from you  Breasts that have increased in firmness, weight, and size 1-3 hours after feeding.  Breasts that are softer immediately after breastfeeding.  Increased milk volume, as well as a change in milk consistency and color by the fifth day of breastfeeding.  Nipples that are not sore, cracked, or bleeding. Signs that your baby is getting enough milk  Wetting at least 1-2 diapers during the first 24 hours after birth.  Wetting at least 5-6 diapers every 24 hours for the first week after birth. The urine should be clear or pale yellow by the age of 5 days.  Wetting 6-8 diapers every 24 hours as your baby continues to grow and develop.  At least 3 stools in a 24-hour period by the age of 5 days. The stool should be soft and yellow.  At least 3 stools in a  24-hour period by the age of 7 days. The stool should be seedy and yellow.  No loss of weight greater than 10% of birth weight during the first 3 days of life.  Average weight gain of 4-7 oz (113-198 g) per week after the age of 4 days.  Consistent daily weight gain by the age of 5 days, without weight loss after the age of 2 weeks. After a feeding, your baby may spit up a small amount of milk. This is normal. Breastfeeding frequency and  duration Frequent feeding will help you make more milk and can prevent sore nipples and extremely full breasts (breast engorgement). Breastfeed when you feel the need to reduce the fullness of your breasts or when your baby shows signs of hunger. This is called "breastfeeding on demand." Signs that your baby is hungry include:  Increased alertness, activity, or restlessness.  Movement of the head from side to side.  Opening of the mouth when the corner of the mouth or cheek is stroked (rooting).  Increased sucking sounds, smacking lips, cooing, sighing, or squeaking.  Hand-to-mouth movements and sucking on fingers or hands.  Fussing or crying. Avoid introducing a pacifier to your baby in the first 4-6 weeks after your baby is born. After this time, you may choose to use a pacifier. Research has shown that pacifier use during the first year of a baby's life decreases the risk of sudden infant death syndrome (SIDS). Allow your baby to feed on each breast as long as he or she wants. When your baby unlatches or falls asleep while feeding from the first breast, offer the second breast. Because newborns are often sleepy in the first few weeks of life, you may need to awaken your baby to get him or her to feed. Breastfeeding times will vary from baby to baby. However, the following rules can serve as a guide to help you make sure that your baby is properly fed:  Newborns (babies 48 weeks of age or younger) may breastfeed every 1-3 hours.  Newborns should not go  without breastfeeding for longer than 3 hours during the day or 5 hours during the night.  You should breastfeed your baby a minimum of 8 times in a 24-hour period. Breast milk pumping     Pumping and storing breast milk allows you to make sure that your baby is exclusively fed your breast milk, even at times when you are unable to breastfeed. This is especially important if you go back to work while you are still breastfeeding, or if you are not able to be present during feedings. Your lactation consultant can help you find a method of pumping that works best for you and give you guidelines about how long it is safe to store breast milk. Caring for your breasts while you breastfeed Nipples can become dry, cracked, and sore while breastfeeding. The following recommendations can help keep your breasts moisturized and healthy:  Avoid using soap on your nipples.  Wear a supportive bra designed especially for nursing. Avoid wearing underwire-style bras or extremely tight bras (sports bras).  Air-dry your nipples for 3-4 minutes after each feeding.  Use only cotton bra pads to absorb leaked breast milk. Leaking of breast milk between feedings is normal.  Use lanolin on your nipples after breastfeeding. Lanolin helps to maintain your skin's normal moisture barrier. Pure lanolin is not harmful (not toxic) to your baby. You may also hand express a few drops of breast milk and gently massage that milk into your nipples and allow the milk to air-dry. In the first few weeks after giving birth, some women experience breast engorgement. Engorgement can make your breasts feel heavy, warm, and tender to the touch. Engorgement peaks within 3-5 days after you give birth. The following recommendations can help to ease engorgement:  Completely empty your breasts while breastfeeding or pumping. You may want to start by applying warm, moist heat (in the shower or with warm, water-soaked hand towels) just before  feeding or pumping. This increases circulation  and helps the milk flow. If your baby does not completely empty your breasts while breastfeeding, pump any extra milk after he or she is finished.  Apply ice packs to your breasts immediately after breastfeeding or pumping, unless this is too uncomfortable for you. To do this: ? Put ice in a plastic bag. ? Place a towel between your skin and the bag. ? Leave the ice on for 20 minutes, 2-3 times a day.  Make sure that your baby is latched on and positioned properly while breastfeeding. If engorgement persists after 48 hours of following these recommendations, contact your health care provider or a Science writer. Overall health care recommendations while breastfeeding  Eat 3 healthy meals and 3 snacks every day. Well-nourished mothers who are breastfeeding need an additional 450-500 calories a day. You can meet this requirement by increasing the amount of a balanced diet that you eat.  Drink enough water to keep your urine pale yellow or clear.  Rest often, relax, and continue to take your prenatal vitamins to prevent fatigue, stress, and low vitamin and mineral levels in your body (nutrient deficiencies).  Do not use any products that contain nicotine or tobacco, such as cigarettes and e-cigarettes. Your baby may be harmed by chemicals from cigarettes that pass into breast milk and exposure to secondhand smoke. If you need help quitting, ask your health care provider.  Avoid alcohol.  Do not use illegal drugs or marijuana.  Talk with your health care provider before taking any medicines. These include over-the-counter and prescription medicines as well as vitamins and herbal supplements. Some medicines that may be harmful to your baby can pass through breast milk.  It is possible to become pregnant while breastfeeding. If birth control is desired, ask your health care provider about options that will be safe while breastfeeding your baby.  Where to find more information: [employer]: www.llli.org Contact a health care provider if:  You feel like you want to stop breastfeeding or have become frustrated with breastfeeding.  Your nipples are cracked or bleeding.  Your breasts are red, tender, or warm.  You have: ? Painful breasts or nipples. ? A swollen area on either breast. ? A fever or chills. ? Nausea or vomiting. ? Drainage other than breast milk from your nipples.  Your breasts do not become full before feedings by the fifth day after you give birth.  You feel sad and depressed.  Your baby is: ? Too sleepy to eat well. ? Having trouble sleeping. ? More than 11 week old and wetting fewer than 6 diapers in a 24-hour period. ? Not gaining weight by 47 days of age.  Your baby has fewer than 3 stools in a 24-hour period.  Your baby's skin or the white parts of his or her eyes become yellow. Get help right away if:  Your baby is overly tired (lethargic) and does not want to wake up and feed.  Your baby develops an unexplained fever. Summary  Breastfeeding offers many health benefits for infant and mothers.  Try to breastfeed your infant when he or she shows early signs of hunger.  Gently tickle or stroke your baby's lips with your finger or nipple to allow the baby to open his or her mouth. Bring the baby to your breast. Make sure that much of the areola is in your baby's mouth. Offer one side and burp the baby before you offer the other side.  Talk with your health care provider or  lactation consultant if you have questions or you face problems as you breastfeed. This information is not intended to replace advice given to you by your health care provider. Make sure you discuss any questions you have with your health care provider. Document Released: 09/23/2005 Document Revised: 12/18/2017 Document Reviewed: 10/25/2016 Elsevier Patient Education  [place]. WHAT OB PATIENTS CAN  EXPECT   Confirmation of pregnancy and ultrasound ordered if medically indicated-[redacted] weeks gestation  New OB (NOB) intake with nurse and New OB (NOB) labs- [redacted] weeks gestation  New OB (NOB) physical examination with provider- 11/[redacted] weeks gestation  Flu vaccine-[redacted] weeks gestation  Anatomy scan-[redacted] weeks gestation  Glucose tolerance test, blood work to test for anemia, T-dap vaccine-[redacted] weeks gestation  Vaginal swabs/cultures-STD/Group B strep-[redacted] weeks gestation  Appointments every 4 weeks until 28 weeks  Every 2 weeks from 28 weeks until 36 weeks  Weekly visits from 45 weeks until delivery  Pain Relief During Labor and Delivery Many things can cause pain during labor and delivery, including:  Pressure on bones and ligaments due to the baby moving through the pelvis.  Stretching of tissues due to the baby moving through the birth canal.  Muscle tension due to anxiety or nervousness.  The uterus tightening (contracting) and relaxing to help move the baby. There are many ways to deal with the pain of labor and delivery. They include:  Taking prenatal classes. Taking these classes helps you know what to expect during your baby's birth. What you learn will increase your confidence and decrease your anxiety.  Practicing relaxation techniques or doing relaxing activities, such as: ? Focused breathing. ? Meditation. ? Visualization. ? Aroma therapy. ? Listening to your favorite music. ? Hypnosis.  Taking a warm shower or bath (hydrotherapy). This may: ? Provide comfort and relaxation. ? Lessen your perception of pain. ? Decrease the amount of pain medicine needed. ? Decrease the length of labor.  Getting a massage or counterpressure on your back.  Applying warm packs or ice packs.  Changing positions often, moving around, or using a birthing ball.  Getting: ? Pain medicine through an IV or injection into a muscle. ? Pain medicine inserted into your spinal column. ?  Injections of sterile water just under the skin on your lower back (intradermal injections). ? Laughing gas (nitrous oxide). Discuss your pain control options with your health care provider during your prenatal visits. Explore the options offered by your hospital or birth center. What kinds of medicine are available? There are two kinds of medicines that can be used to relieve pain during labor and delivery:  Analgesics. These medicines decrease pain without causing you to lose feeling or the ability to move your muscles.  Anesthetics. These medicines block feeling in the body and can decrease your ability to move freely. Both of these kinds of medicine can cause minor side effects, such as nausea, trouble concentrating, and sleepiness. They can also decrease the baby's heart rate before birth and affect the baby's breathing rate after birth. For this reason, health care providers are careful about when and how much medicine is given. What are specific medicines and procedures that provide pain relief? Local Anesthetics Local anesthetics are used to numb a small area of the body. They may be used along with another kind of anesthetic or used to numb the nerves of the vagina, cervix, and perineum during the second stage of labor. General Anesthetics General anesthetics cause you to lose consciousness so you do not feel  pain. They are usually only used for an emergency cesarean delivery. General anesthetics are given through an IV tube and a mask. Pudendal Block A pudendal block is a form of local anesthetic. It may be used to relieve the pain associated with pushing or stretching of the perineum at the time of delivery or to further numb the perineum. A pudendal block is done by injecting numbing medicine through the vaginal wall into a nerve in the pelvis. Epidural Analgesia Epidural analgesia is given through a flexible IV catheter that is inserted into the lower back. Numbing medicine is  delivered continuously to the area near your spinal column nerves (epidural space). After having this type of analgesia, you may be able to move your legs but you most likely will not be able to walk. Depending on the amount of medicine given, you may lose all feeling in the lower half of your body, or you may retain some level of sensation, including the urge to push. Epidural analgesia can be used to provide pain relief for a vaginal birth. Spinal Block A spinal block is similar to epidural analgesia, but the medicine is injected into the spinal fluid instead of the epidural space. A spinal block is only given once. It starts to relieve pain quickly, but the pain relief lasts only 1-6 hours. Spinal blocks can be used for cesarean deliveries. Combined Spinal-Epidural (CSE) Block A CSE block combines the effects of a spinal block and epidural analgesia. The spinal block works quickly to block all pain. The epidural analgesia provides continuous pain relief, even after the effects of the spinal block have worn off. This information is not intended to replace advice given to you by your health care provider. Make sure you discuss any questions you have with your health care provider. Document Released: 01/09/2009 Document Revised: 09/05/2017 Document Reviewed: 02/14/2016 Elsevier Patient Education  2020 [employer]. [facility]  [place], [place], [place] [address]  Phone: [phone]   [facility] (second location)  [address] [place], [place] [address]  Phone: [phone]   [facility]) [place], [place], [place] [address] Phone: [phone]   [address] [place], [place] [address]  Phone: [phone] Medications Safe in Pregnancy  Acne:      Constipation:  Benzoyl Peroxide     Colace  Clindamycin      Dulcolax Suppository  Topica Erythromycin      Fibercon  Salicylic Acid      Metamucil         Miralax AVOID:        Senakot   Accutane    Cough:  Retin-A       Cough Drops  Tetracycline      Phenergan w/ Codeine if Rx  Minocycline      Robitussin (Plain & DM)  Antibiotics:     Crabs/Lice:  Ceclor       RID  Cephalosporins    AVOID:  E-Mycins      Kwell  Keflex  Macrobid/Macrodantin   Diarrhea:  Penicillin      Kao-Pectate  Zithromax      Imodium AD         PUSH FLUIDS AVOID:       Cipro     Fever:  Tetracycline      Tylenol (Regular or Extra  Minocycline       Strength)  Levaquin  Extra Strength-Do not          Exceed 8 tabs/24 hrs Caffeine:        <271m/day (equiv. To 1 cup of coffee or  approx. 3 12 oz sodas)         Gas: Cold/Hayfever:       Gas-X  Benadryl      Mylicon  Claritin       Phazyme  **Claritin-D        Chlor-Trimeton    Headaches:  Dimetapp      ASA-Free Excedrin  Drixoral-Non-Drowsy     Cold Compress  Mucinex ([place])     Tylenol (Regular or Extra  Sudafed/Sudafed-12 Hour     Strength)  **Sudafed PE Pseudoephedrine   Tylenol Cold & Sinus     Vicks Vapor Rub  Zyrtec  **AVOID if Problems With Blood Pressure         Heartburn: Avoid lying down for at least 1 hour after meals  Aciphex      Maalox     Rash:  Milk of Magnesia     Benadryl    Mylanta       1% Hydrocortisone Cream  Pepcid  Pepcid Complete   Sleep Aids:  Prevacid      Ambien   Prilosec       Benadryl  Rolaids       Chamomile Tea  Tums (Limit 4/day)     Unisom  Zantac       Tylenol PM         Warm milk-add vanilla or  Hemorrhoids:       Sugar for taste  Anusol/Anusol H.C.  (RX: Analapram 2.5%)  Sugar Substitutes:  Hydrocortisone OTC     Ok in moderation  Preparation H      Tucks        Vaseline lotion applied to tissue with wiping    Herpes:     Throat:  Acyclovir      Oragel  Famvir  Valtrex     Vaccines:         Flu Shot Leg Cramps:       *Gardasil  Benadryl      Hepatitis A         Hepatitis B  Nasal Spray:       Pneumovax  Saline Nasal Spray     Polio Booster         Tetanus Nausea:       Tuberculosis test or PPD  Vitamin B6 25 mg TID   AVOID:    Dramamine      *Gardasil  Emetrol       Live Poliovirus  Ginger Root 250 mg QID    MMR (measles, mumps &  High Complex Carbs @ Bedtime    rebella)  Sea Bands-Accupressure    Varicella (Chickenpox)  Unisom 1/2 tab TID     *No known complications           If received before Pain:         Known pregnancy;   Darvocet       Resume series after  Lortab        Delivery  Percocet    Yeast:   Tramadol      Femstat  Tylenol 3      Gyne-lotrimin  Ultram       Monistat  Vicodin           MISC:  All Sunscreens           Hair Coloring/highlights          Insect Repellant's          (Including DEET)         Mystic Tans Third Trimester of Pregnancy  The third trimester is from week 28 through week 40 (months 7 through 9). This trimester is when your unborn baby (fetus) is growing very fast. At the end of the ninth month, the unborn baby is about 20 inches in length. It weighs about 6-10 pounds. Follow these instructions at home: Medicines  Take over-the-counter and prescription medicines only as told by your doctor. Some medicines are safe and some medicines are not safe during pregnancy.  Take a prenatal vitamin that contains at least 600 micrograms (mcg) of folic acid.  If you have trouble pooping (constipation), take medicine that will make your stool soft (stool softener) if your doctor approves. Eating and drinking   Eat regular, healthy meals.  Avoid raw meat and uncooked cheese.  If you get low calcium from the food you eat, talk to your doctor about taking a daily calcium supplement.  Eat four or five small meals rather than three large meals a day.  Avoid foods that are high in fat and sugars, such as fried and sweet foods.  To prevent constipation: ? Eat foods that are high in fiber, like fresh fruits and  vegetables, whole grains, and beans. ? Drink enough fluids to keep your pee (urine) clear or pale yellow. Activity  Exercise only as told by your doctor. Stop exercising if you start to have cramps.  Avoid heavy lifting, wear low heels, and sit up straight.  Do not exercise if it is too hot, too humid, or if you are in a place of great height (high altitude).  You may continue to have sex unless your doctor tells you not to. Relieving pain and discomfort  Wear a good support bra if your breasts are tender.  Take frequent breaks and rest with your legs raised if you have leg cramps or low back pain.  Take warm water baths (sitz baths) to soothe pain or discomfort caused by hemorrhoids. Use hemorrhoid cream if your doctor approves.  If you develop puffy, bulging veins (varicose veins) in your legs: ? Wear support hose or compression stockings as told by your doctor. ? Raise (elevate) your feet for 15 minutes, 3-4 times a day. ? Limit salt in your food. Safety  Wear your seat belt when driving.  Make a list of emergency phone numbers, including numbers for family, friends, the hospital, and police and fire departments. Preparing for your baby's arrival To prepare for the arrival of your baby:  Take prenatal classes.  Practice driving to the hospital.  Visit the hospital and tour the maternity area.  Talk to your work about taking leave once the baby comes.  Pack your hospital bag.  Prepare the baby's room.  Go to your doctor visits.  Buy a rear-facing car seat. Learn how to install it in your car. General instructions  Do not use hot tubs, steam rooms, or saunas.  Do not use any products that contain nicotine or tobacco, such as cigarettes and e-cigarettes. If you need help quitting, ask your doctor.  Do not drink alcohol.  Do not douche or use tampons or scented sanitary pads.  Do not cross your legs for long periods of time.  Do not travel for long  distances  unless you must. Only do so if your doctor says it is okay.  Visit your dentist if you have not gone during your pregnancy. Use a soft toothbrush to brush your teeth. Be gentle when you floss.  Avoid cat litter boxes and soil used by cats. These carry germs that can cause birth defects in the baby and can cause a loss of your baby (miscarriage) or stillbirth.  Keep all your prenatal visits as told by your doctor. This is important. Contact a doctor if:  You are not sure if you are in labor or if your water has broken.  You are dizzy.  You have mild cramps or pressure in your lower belly.  You have a nagging pain in your belly area.  You continue to feel sick to your stomach, you throw up, or you have watery poop.  You have bad smelling fluid coming from your vagina.  You have pain when you pee. Get help right away if:  You have a fever.  You are leaking fluid from your vagina.  You are spotting or bleeding from your vagina.  You have severe belly cramps or pain.  You lose or gain weight quickly.  You have trouble catching your breath and have chest pain.  You notice sudden or extreme puffiness (swelling) of your face, hands, ankles, feet, or legs.  You have not felt the baby move in over an hour.  You have severe headaches that do not go away with medicine.  You have trouble seeing.  You are leaking, or you are having a gush of fluid, from your vagina before you are 37 weeks.  You have regular belly spasms (contractions) before you are 37 weeks. Summary  The third trimester is from week 28 through week 40 (months 7 through 9). This time is when your unborn baby is growing very fast.  Follow your doctor's advice about medicine, food, and activity.  Get ready for the arrival of your baby by taking prenatal classes, getting all the baby items ready, preparing the baby's room, and visiting your doctor to be checked.  Get help right away if you are bleeding from your  vagina, or you have chest pain and trouble catching your breath, or if you have not felt your baby move in over an hour. This information is not intended to replace advice given to you by your health care provider. Make sure you discuss any questions you have with your health care provider. Document Released: 12/18/2009 Document Revised: 01/14/2019 Document Reviewed: 10/29/2016 Elsevier Patient Education  2020 [employer].

## 2019-07-06 NOTE — Progress Notes (Signed)
ROB-Doing well, no questions or concerns. 28 week labs today, see orders. Flu vaccine and TDaP given, see chart. Ready, Set, Baby and Breastfeeding checklist completed, see chart. Enrolled in classes. Plans epidural. Unsure about contraception. Third trimester handouts provided. Anticipatory guidance regarding course of pregnancy care. Reviewed red flag symptoms and when to call. RTC x 2 weeks for ROB or sooner if needed.

## 2019-07-07 LAB — CBC
Hematocrit: 36.9 % (ref 34.0–46.6)
Hemoglobin: 12.7 g/dL (ref 11.1–15.9)
MCH: 30.8 pg (ref 26.6–33.0)
MCHC: 34.4 g/dL (ref 31.5–35.7)
MCV: 90 fL (ref 79–97)
Platelets: 190 10*3/uL (ref 150–450)
RBC: 4.12 x10E6/uL (ref 3.77–5.28)
RDW: 12.9 % (ref 11.7–15.4)
WBC: 11 10*3/uL — ABNORMAL HIGH (ref 3.4–10.8)

## 2019-07-07 LAB — SYPHILIS: RPR W/REFLEX TO RPR TITER AND TREPONEMAL ANTIBODIES, TRADITIONAL SCREENING AND DIAGNOSIS ALGORITHM: RPR Ser Ql: NONREACTIVE

## 2019-07-07 LAB — GLUCOSE, 1 HOUR GESTATIONAL: Gestational Diabetes Screen: 97 mg/dL (ref 65–139)

## 2019-07-19 ENCOUNTER — Other Ambulatory Visit: Payer: Self-pay

## 2019-07-19 ENCOUNTER — Encounter: Payer: Self-pay | Admitting: Certified Nurse Midwife

## 2019-07-19 ENCOUNTER — Ambulatory Visit (INDEPENDENT_AMBULATORY_CARE_PROVIDER_SITE_OTHER): Admitting: Certified Nurse Midwife

## 2019-07-19 VITALS — BP 98/70 | HR 93 | Wt 225.1 lb

## 2019-07-19 DIAGNOSIS — Z3403 Encounter for supervision of normal first pregnancy, third trimester: Secondary | ICD-10-CM

## 2019-07-19 NOTE — Patient Instructions (Signed)
How a Baby Grows During Pregnancy  Pregnancy begins when a female's sperm enters a female's egg (fertilization). Fertilization usually happens in one of the tubes (fallopian tubes) that connect the ovaries to the womb (uterus). The fertilized egg moves down the fallopian tube to the uterus. Once it reaches the uterus, it implants into the lining of the uterus and begins to grow. For the first 10 weeks, the fertilized egg is called an embryo. After 10 weeks, it is called a fetus. As the fetus continues to grow, it receives oxygen and nutrients through tissue (placenta) that grows to support the developing baby. The placenta is the life support system for the baby. It provides oxygen and nutrition and removes waste. Learning as much as you can about your pregnancy and how your baby is developing can help you enjoy the experience. It can also make you aware of when there might be a problem and when to ask questions. How long does a typical pregnancy last? A pregnancy usually lasts 280 days, or about 40 weeks. Pregnancy is divided into three periods of growth, also called trimesters:  First trimester: 0-12 weeks.  Second trimester: 13-27 weeks.  Third trimester: 28-40 weeks. The day when your baby is ready to be born (full term) is your estimated date of delivery. How does my baby develop month by month? First month  The fertilized egg attaches to the inside of the uterus.  Some cells will form the placenta. Others will form the fetus.  The arms, legs, brain, spinal cord, lungs, and heart begin to develop.  At the end of the first month, the heart begins to beat. Second month  The bones, inner ear, eyelids, hands, and feet form.  The genitals develop.  By the end of 8 weeks, all major organs are developing. Third month  All of the internal organs are forming.  Teeth develop below the gums.  Bones and muscles begin to grow. The spine can flex.  The skin is transparent.  Fingernails  and toenails begin to form.  Arms and legs continue to grow longer, and hands and feet develop.  The fetus is about 3 inches (7.6 cm) long. Fourth month  The placenta is completely formed.  The external sex organs, neck, outer ear, eyebrows, eyelids, and fingernails are formed.  The fetus can hear, swallow, and move its arms and legs.  The kidneys begin to produce urine.  The skin is covered with a white, waxy coating (vernix) and very fine hair (lanugo). Fifth month  The fetus moves around more and can be felt for the first time (quickening).  The fetus starts to sleep and wake up and may begin to suck its finger.  The nails grow to the end of the fingers.  The organ in the digestive system that makes bile (gallbladder) functions and helps to digest nutrients.  If your baby is a girl, eggs are present in her ovaries. If your baby is a boy, testicles start to move down into his scrotum. Sixth month  The lungs are formed.  The eyes open. The brain continues to develop.  Your baby has fingerprints and toe prints. Your baby's hair grows thicker.  At the end of the second trimester, the fetus is about 9 inches (22.9 cm) long. Seventh month  The fetus kicks and stretches.  The eyes are developed enough to sense changes in light.  The hands can make a grasping motion.  The fetus responds to sound. Eighth month  All   organs and body systems are fully developed and functioning.  Bones harden, and taste buds develop. The fetus may hiccup.  Certain areas of the brain are still developing. The skull remains soft. Ninth month  The fetus gains about  lb (0.23 kg) each week.  The lungs are fully developed.  Patterns of sleep develop.  The fetus's head typically moves into a head-down position (vertex) in the uterus to prepare for birth.  The fetus weighs 6-9 lb (2.72-4.08 kg) and is 19-20 inches (48.26-50.8 cm) long. What can I do to have a healthy pregnancy and help  my baby develop? General instructions  Take prenatal vitamins as directed by your health care provider. These include vitamins such as folic acid, iron, calcium, and vitamin D. They are important for healthy development.  Take medicines only as directed by your health care provider. Read labels and ask a pharmacist or your health care provider whether over-the-counter medicines, supplements, and prescription drugs are safe to take during pregnancy.  Keep all follow-up visits as directed by your health care provider. This is important. Follow-up visits include prenatal care and screening tests. How do I know if my baby is developing well? At each prenatal visit, your health care provider will do several different tests to check on your health and keep track of your baby's development. These include:  Fundal height and position. ? Your health care provider will measure your growing belly from your pubic bone to the top of the uterus using a tape measure. ? Your health care provider will also feel your belly to determine your baby's position.  Heartbeat. ? An ultrasound in the first trimester can confirm pregnancy and show a heartbeat, depending on how far along you are. ? Your health care provider will check your baby's heart rate at every prenatal visit.  Second trimester ultrasound. ? This ultrasound checks your baby's development. It also may show your baby's gender. What should I do if I have concerns about my baby's development? Always talk with your health care provider about any concerns that you may have about your pregnancy and your baby. Summary  A pregnancy usually lasts 280 days, or about 40 weeks. Pregnancy is divided into three periods of growth, also called trimesters.  Your health care provider will monitor your baby's growth and development throughout your pregnancy.  Follow your health care provider's recommendations about taking prenatal vitamins and medicines during  your pregnancy.  Talk with your health care provider if you have any concerns about your pregnancy or your developing baby. This information is not intended to replace advice given to you by your health care provider. Make sure you discuss any questions you have with your health care provider. Document Released: 03/11/2008 Document Revised: 01/14/2019 Document Reviewed: 08/06/2017 Elsevier Patient Education  2020 [employer] Inc[employer]  

## 2019-07-19 NOTE — Progress Notes (Signed)
ROB doing well. Feels good movement. Anticipatory guidance about remainder of pregnancy.  She verbalizes and agrees to plan of care. Follow up 2 wks.   Philip Aspen, CNM

## 2019-07-22 ENCOUNTER — Telehealth: Payer: Self-pay | Admitting: Certified Nurse Midwife

## 2019-07-22 NOTE — Telephone Encounter (Signed)
Called and spoke with patient.  Hiccups have stopped but has not felt baby move in the last 1.5 hrs even after drinking cold water and eating some chocolate.  Advised patient to lay on left side and if no FM in the hour to call us back.  Patient verbalized understanding.

## 2019-07-22 NOTE — Telephone Encounter (Signed)
The patient called and stated that she is 30 wks and her baby has the hiccups. Pt stated she can feel the baby hiccupping but is concerned because it has been going on for 10 minutes now. Please advise.

## 2019-08-02 ENCOUNTER — Ambulatory Visit (INDEPENDENT_AMBULATORY_CARE_PROVIDER_SITE_OTHER): Admitting: Certified Nurse Midwife

## 2019-08-02 ENCOUNTER — Other Ambulatory Visit: Payer: Self-pay

## 2019-08-02 VITALS — BP 112/66 | HR 97 | Wt 230.2 lb

## 2019-08-02 DIAGNOSIS — Z3A31 31 weeks gestation of pregnancy: Secondary | ICD-10-CM

## 2019-08-02 DIAGNOSIS — Z3493 Encounter for supervision of normal pregnancy, unspecified, third trimester: Secondary | ICD-10-CM

## 2019-08-02 LAB — POCT URINALYSIS DIPSTICK OB
Bilirubin, UA: NEGATIVE
Blood, UA: NEGATIVE
Glucose, UA: NEGATIVE
Ketones, UA: NEGATIVE
Nitrite, UA: NEGATIVE
POC,PROTEIN,UA: NEGATIVE
Spec Grav, UA: 1.01 (ref 1.010–1.025)
Urobilinogen, UA: 0.2 E.U./dL
pH, UA: 6.5 (ref 5.0–8.0)

## 2019-08-02 NOTE — Progress Notes (Signed)
ROB-Doing well, reports intermittent right sciatic pain after sitting for greater than an hour. Discussed home treatment measures including Spinning Babies; handouts provided. Plans POP as postpartum contraceptions. Using [employer]. Anticipatory guidance regarding course of prenatal care. Reviewed red flag symptoms and when to call. RTC x 2 weeks for ROB or sooner if needed.

## 2019-08-02 NOTE — Patient Instructions (Addendum)
Fetal Movement Counts Patient Name: ________________________________________________ Patient Due Date: ____________________ What is a fetal movement count?  A fetal movement count is the number of times that you feel your baby move during a certain amount of time. This may also be called a fetal kick count. A fetal movement count is recommended for every pregnant woman. You may be asked to start counting fetal movements as early as week 28 of your pregnancy. Pay attention to when your baby is most active. You may notice your baby's sleep and wake cycles. You may also notice things that make your baby move more. You should do a fetal movement count:  When your baby is normally most active.  At the same time each day. A good time to count movements is while you are resting, after having something to eat and drink. How do I count fetal movements? 1. Find a quiet, comfortable area. Sit, or lie down on your side. 2. Write down the date, the start time and stop time, and the number of movements that you felt between those two times. Take this information with you to your health care visits. 3. For 2 hours, count kicks, flutters, swishes, rolls, and jabs. You should feel at least 10 movements during 2 hours. 4. You may stop counting after you have felt 10 movements. 5. If you do not feel 10 movements in 2 hours, have something to eat and drink. Then, keep resting and counting for 1 hour. If you feel at least 4 movements during that hour, you may stop counting. Contact a health care provider if:  You feel fewer than 4 movements in 2 hours.  Your baby is not moving like he or she usually does. Date: ____________ Start time: ____________ Stop time: ____________ Movements: ____________ Date: ____________ Start time: ____________ Stop time: ____________ Movements: ____________ Date: ____________ Start time: ____________ Stop time: ____________ Movements: ____________ Date: ____________ Start time:  ____________ Stop time: ____________ Movements: ____________ Date: ____________ Start time: ____________ Stop time: ____________ Movements: ____________ Date: ____________ Start time: ____________ Stop time: ____________ Movements: ____________ Date: ____________ Start time: ____________ Stop time: ____________ Movements: ____________ Date: ____________ Start time: ____________ Stop time: ____________ Movements: ____________ Date: ____________ Start time: ____________ Stop time: ____________ Movements: ____________ This information is not intended to replace advice given to you by your health care provider. Make sure you discuss any questions you have with your health care provider. Document Released: 10/23/2006 Document Revised: 10/13/2018 Document Reviewed: 11/02/2015 Elsevier Patient Education  2020 [employer] Inc[employer]  Sciatica  Sciatica is pain, weakness, tingling, or loss of feeling (numbness) along the sciatic nerve. The sciatic nerve starts in the lower back and goes down the back of each leg. Sciatica usually goes away on its own or with treatment. Sometimes, sciatica may come back (recur). What are the causes? This condition happens when the sciatic nerve is pinched or has pressure put on it. This may be the result of:  A disk in between the bones of the spine bulging out too far (herniated disk).  Changes in the spinal disks that occur with aging.  A condition that affects a muscle in the butt.  Extra bone growth near the sciatic nerve.  A break (fracture) of the area between your hip bones (pelvis).  Pregnancy.  Tumor. This is rare. What increases the risk? You are more likely to develop this condition if you:  Play sports that put pressure or stress on the spine.  Have poor strength and ease of movement (  flexibility).  Have had a back injury in the past.  Have had back surgery.  Sit for long periods of time.  Do activities that involve bending or lifting over and  over again.  Are very overweight (obese). What are the signs or symptoms? Symptoms can vary from mild to very bad. They may include:  Any of these problems in the lower back, leg, hip, or butt: ? Mild tingling, loss of feeling, or dull aches. ? Burning sensations. ? Sharp pains.  Loss of feeling in the back of the calf or the sole of the foot.  Leg weakness.  Very bad back pain that makes it hard to move. These symptoms may get worse when you cough, sneeze, or laugh. They may also get worse when you sit or stand for long periods of time. How is this treated? This condition often gets better without any treatment. However, treatment may include:  Changing or cutting back on physical activity when you have pain.  Doing exercises and stretching.  Putting ice or heat on the affected area.  Medicines that help: ? To relieve pain and swelling. ? To relax your muscles.  Shots (injections) of medicines that help to relieve pain, irritation, and swelling.  Surgery. Follow these instructions at home: Medicines  Take over-the-counter and prescription medicines only as told by your doctor.  Ask your doctor if the medicine prescribed to you: ? Requires you to avoid driving or using heavy machinery. ? Can cause trouble pooping (constipation). You may need to take these steps to prevent or treat trouble pooping:  Drink enough fluids to keep your pee (urine) pale yellow.  Take over-the-counter or prescription medicines.  Eat foods that are high in fiber. These include beans, whole grains, and fresh fruits and vegetables.  Limit foods that are high in fat and sugar. These include fried or sweet foods. Managing pain      If told, put ice on the affected area. ? Put ice in a plastic bag. ? Place a towel between your skin and the bag. ? Leave the ice on for 20 minutes, 2-3 times a day.  If told, put heat on the affected area. Use the heat source that your doctor tells you to  use, such as a moist heat pack or a heating pad. ? Place a towel between your skin and the heat source. ? Leave the heat on for 20-30 minutes. ? Remove the heat if your skin turns bright red. This is very important if you are unable to feel pain, heat, or cold. You may have a greater risk of getting burned. Activity   Return to your normal activities as told by your doctor. Ask your doctor what activities are safe for you.  Avoid activities that make your symptoms worse.  Take short rests during the day. ? When you rest for a long time, do some physical activity or stretching between periods of rest. ? Avoid sitting for a long time without moving. Get up and move around at least one time each hour.  Exercise and stretch regularly, as told by your doctor.  Do not lift anything that is heavier than 10 lb (4.5 kg) while you have symptoms of sciatica. ? Avoid lifting heavy things even when you do not have symptoms. ? Avoid lifting heavy things over and over.  When you lift objects, always lift in a way that is safe for your body. To do this, you should: ? Bend your knees. ? Keep the  object close to your body. ? Avoid twisting. General instructions  Stay at a healthy weight.  Wear comfortable shoes that support your feet. Avoid wearing high heels.  Avoid sleeping on a mattress that is too soft or too hard. You might have less pain if you sleep on a mattress that is firm enough to support your back.  Keep all follow-up visits as told by your doctor. This is important. Contact a doctor if:  You have pain that: ? Wakes you up when you are sleeping. ? Gets worse when you lie down. ? Is worse than the pain you have had in the past. ? Lasts longer than 4 weeks.  You lose weight without trying. Get help right away if:  You cannot control when you pee (urinate) or poop (have a bowel movement).  You have weakness in any of these areas and it gets worse: ? Lower back. ? The area  between your hip bones. ? Butt. ? Legs.  You have redness or swelling of your back.  You have a burning feeling when you pee. Summary  Sciatica is pain, weakness, tingling, or loss of feeling (numbness) along the sciatic nerve.  This condition happens when the sciatic nerve is pinched or has pressure put on it.  Sciatica can cause pain, tingling, or loss of feeling (numbness) in the lower back, legs, hips, and butt.  Treatment often includes rest, exercise, medicines, and putting ice or heat on the affected area. This information is not intended to replace advice given to you by your health care provider. Make sure you discuss any questions you have with your health care provider. Document Released: 07/02/2008 Document Revised: 10/12/2018 Document Reviewed: 10/12/2018 Elsevier Patient Education  [place].

## 2019-08-02 NOTE — Progress Notes (Signed)
ROB-No complaints.  

## 2019-08-18 ENCOUNTER — Encounter: Payer: Self-pay | Admitting: Certified Nurse Midwife

## 2019-08-18 ENCOUNTER — Other Ambulatory Visit: Payer: Self-pay

## 2019-08-18 ENCOUNTER — Ambulatory Visit (INDEPENDENT_AMBULATORY_CARE_PROVIDER_SITE_OTHER): Admitting: Certified Nurse Midwife

## 2019-08-18 VITALS — BP 101/71 | HR 105 | Wt 233.1 lb

## 2019-08-18 DIAGNOSIS — R102 Pelvic and perineal pain: Secondary | ICD-10-CM | POA: Diagnosis not present

## 2019-08-18 DIAGNOSIS — O26893 Other specified pregnancy related conditions, third trimester: Secondary | ICD-10-CM | POA: Diagnosis not present

## 2019-08-18 DIAGNOSIS — Z3A34 34 weeks gestation of pregnancy: Secondary | ICD-10-CM

## 2019-08-18 DIAGNOSIS — Z3493 Encounter for supervision of normal pregnancy, unspecified, third trimester: Secondary | ICD-10-CM

## 2019-08-18 LAB — POCT URINALYSIS DIPSTICK OB
Bilirubin, UA: NEGATIVE
Blood, UA: NEGATIVE
Glucose, UA: NEGATIVE
Ketones, UA: NEGATIVE
Leukocytes, UA: NEGATIVE
Nitrite, UA: NEGATIVE
POC,PROTEIN,UA: NEGATIVE
Spec Grav, UA: 1.02 (ref 1.010–1.025)
Urobilinogen, UA: 0.2 E.U./dL
pH, UA: 5 (ref 5.0–8.0)

## 2019-08-18 NOTE — Progress Notes (Signed)
Body mass index is 35.45 kg/m.  ROB doing well. Pt having significant pelvic pain . She has been wearing her belly band with little improvement. She states she has difficulty walking, and getting up and down from sitting , getting in and out of car. She is requesting pelvic floor therapy. Orders placed. On ascultation of fetal heart tones , FHR noted 170-180's. NST completed. See below  Ebonie Westerlund 1990-10-10 [redacted]w[redacted]d  Fetus A Non-Stress Test Interpretation for 08/18/19  Indication: Elevated sustained FHR with doppler   Baseline 145, moderate variability, accelerations present, decelerations absent. No contractions  FHR initally in 180s, pt states baby had been moving , after 6 min on monitor FHR decreased back to baseline.   Reactive NST, category 1 strip. Reassurance given to pt. Follow up 2 wk ROB.   Philip Aspen, CNM

## 2019-08-18 NOTE — Patient Instructions (Signed)
Braxton Hicks Contractions Contractions of the uterus can occur throughout pregnancy, but they are not always a sign that you are in labor. You may have practice contractions called Braxton Hicks contractions. These false labor contractions are sometimes confused with true labor. What are Braxton Hicks contractions? Braxton Hicks contractions are tightening movements that occur in the muscles of the uterus before labor. Unlike true labor contractions, these contractions do not result in opening (dilation) and thinning of the cervix. Toward the end of pregnancy (32-34 weeks), Braxton Hicks contractions can happen more often and may become stronger. These contractions are sometimes difficult to tell apart from true labor because they can be very uncomfortable. You should not feel embarrassed if you go to the hospital with false labor. Sometimes, the only way to tell if you are in true labor is for your health care provider to look for changes in the cervix. The health care provider will do a physical exam and may monitor your contractions. If you are not in true labor, the exam should show that your cervix is not dilating and your water has not broken. If there are no other health problems associated with your pregnancy, it is completely safe for you to be sent home with false labor. You may continue to have Braxton Hicks contractions until you go into true labor. How to tell the difference between true labor and false labor True labor  Contractions last 30-70 seconds.  Contractions become very regular.  Discomfort is usually felt in the top of the uterus, and it spreads to the lower abdomen and low back.  Contractions do not go away with walking.  Contractions usually become more intense and increase in frequency.  The cervix dilates and gets thinner. False labor  Contractions are usually shorter and not as strong as true labor contractions.  Contractions are usually irregular.  Contractions  are often felt in the front of the lower abdomen and in the groin.  Contractions may go away when you walk around or change positions while lying down.  Contractions get weaker and are shorter-lasting as time goes on.  The cervix usually does not dilate or become thin. Follow these instructions at home:   Take over-the-counter and prescription medicines only as told by your health care provider.  Keep up with your usual exercises and follow other instructions from your health care provider.  Eat and drink lightly if you think you are going into labor.  If Braxton Hicks contractions are making you uncomfortable: ? Change your position from lying down or resting to walking, or change from walking to resting. ? Sit and rest in a tub of warm water. ? Drink enough fluid to keep your urine pale yellow. Dehydration may cause these contractions. ? Do slow and deep breathing several times an hour.  Keep all follow-up prenatal visits as told by your health care provider. This is important. Contact a health care provider if:  You have a fever.  You have continuous pain in your abdomen. Get help right away if:  Your contractions become stronger, more regular, and closer together.  You have fluid leaking or gushing from your vagina.  You pass blood-tinged mucus (bloody show).  You have bleeding from your vagina.  You have low back pain that you never had before.  You feel your baby's head pushing down and causing pelvic pressure.  Your baby is not moving inside you as much as it used to. Summary  Contractions that occur before labor are   called Braxton Hicks contractions, false labor, or practice contractions.  Braxton Hicks contractions are usually shorter, weaker, farther apart, and less regular than true labor contractions. True labor contractions usually become progressively stronger and regular, and they become more frequent.  Manage discomfort from Braxton Hicks contractions  by changing position, resting in a warm bath, drinking plenty of water, or practicing deep breathing. This information is not intended to replace advice given to you by your health care provider. Make sure you discuss any questions you have with your health care provider. Document Released: 02/06/2017 Document Revised: 09/05/2017 Document Reviewed: 02/06/2017 Elsevier Patient Education  2020 Elsevier Inc.  

## 2019-08-30 ENCOUNTER — Telehealth: Payer: Self-pay

## 2019-08-30 NOTE — Telephone Encounter (Signed)
Mychart message sent to patient for her to call her insurance company to see how to go about getting a prior [place].

## 2019-08-31 ENCOUNTER — Ambulatory Visit (INDEPENDENT_AMBULATORY_CARE_PROVIDER_SITE_OTHER): Admitting: Certified Nurse Midwife

## 2019-08-31 ENCOUNTER — Other Ambulatory Visit: Payer: Self-pay

## 2019-08-31 ENCOUNTER — Encounter: Payer: Self-pay | Admitting: Certified Nurse Midwife

## 2019-08-31 VITALS — BP 104/60 | HR 79 | Wt 237.5 lb

## 2019-08-31 DIAGNOSIS — Z3493 Encounter for supervision of normal pregnancy, unspecified, third trimester: Secondary | ICD-10-CM

## 2019-08-31 NOTE — Progress Notes (Signed)
Rob doing well. Feels good movement. GBS and cultures today. SVE finger tip/long/ballatoble. Discussed labor precautions. Has herbal prep hand out. Reviewed MD or guest midwives possible for delivery. Follow up 1 wk.   Philip Aspen, CNM

## 2019-08-31 NOTE — Patient Instructions (Signed)
Group B Streptococcus Infection During Pregnancy  Group B Streptococcus (GBS) is a type of bacteria (Streptococcus agalactiae) that is often found in healthy people, commonly in the rectum, vagina, and intestines. In people who are healthy and not pregnant, the bacteria rarely cause serious illness or complications. However, women who test positive for GBS during pregnancy can pass the bacteria to their baby during childbirth, which can cause serious infection in the baby after birth. Women with GBS may also have infections during their pregnancy or immediately after childbirth, such as urinary tract infections (UTIs) or infections of the uterus (uterine infections). Having GBS also increases a woman's risk of complications during pregnancy, such as early (preterm) labor or delivery, miscarriage, or stillbirth. Routine testing (screening) for GBS is recommended for all pregnant women. What increases the risk? You may have a higher risk for GBS infection during pregnancy if you had one during a past pregnancy. What are the signs or symptoms? In most cases, GBS infection does not cause symptoms in pregnant women. Signs and symptoms of a possible GBS-related infection may include:  Labor starting before the 37th week of pregnancy.  A UTI or bladder infection, which may cause: ? Fever. ? Pain or burning during urination. ? Frequent urination.  Fever during labor, along with: ? Bad-smelling discharge. ? Uterine tenderness. ? Rapid heartbeat in the mother, baby, or both. Rare but serious symptoms of a possible GBS-related infection in women include:  Blood infection (septicemia). This may cause fever, chills, or confusion.  Lung infection (pneumonia). This may cause fever, chills, cough, rapid breathing, difficulty breathing, or chest pain.  Bone, joint, skin, or soft tissue infection. How is this diagnosed? You may be screened for GBS between week 35 and week 37 of your pregnancy. If you have  symptoms of preterm labor, you may be screened earlier. This condition is diagnosed based on lab test results from:  A swab of fluid from the vagina and rectum.  A urine sample. How is this treated? This condition is treated with antibiotic medicine. When you go into labor, or as soon as your water breaks (your membranes rupture), you will be given antibiotics through an IV tube. Antibiotics will continue until after you give birth. If you are having a cesarean delivery, you do not need antibiotics unless your membranes have already ruptured. Follow these instructions at home:  Take over-the-counter and prescription medicines only as told by your health care provider.  Take your antibiotic medicine as told by your health care provider. Do not stop taking the antibiotic even if you start to feel better.  Keep all pre-birth (prenatal) visits and follow-up visits as told by your health care provider. This is important. Contact a health care provider if:  You have pain or burning when you urinate.  You have to urinate frequently.  You have a fever or chills.  You develop a bad-smelling vaginal discharge. Get help right away if:  Your membranes rupture.  You go into labor.  You have severe pain in your abdomen.  You have difficulty breathing.  You have chest pain. This information is not intended to replace advice given to you by your health care provider. Make sure you discuss any questions you have with your health care provider. Document Released: 12/31/2007 Document Revised: 01/14/2019 Document Reviewed: 04/18/2016 Elsevier Patient Education  2020 Elsevier Inc.  

## 2019-09-02 LAB — STREP GP B NAA: Strep Gp B NAA: NEGATIVE

## 2019-09-04 LAB — GC/CHLAMYDIA PROBE AMP
Chlamydia trachomatis, NAA: NEGATIVE
Neisseria Gonorrhoeae by PCR: NEGATIVE

## 2019-09-07 ENCOUNTER — Ambulatory Visit (INDEPENDENT_AMBULATORY_CARE_PROVIDER_SITE_OTHER): Admitting: Certified Nurse Midwife

## 2019-09-07 ENCOUNTER — Other Ambulatory Visit: Payer: Self-pay

## 2019-09-07 ENCOUNTER — Encounter: Payer: Self-pay | Admitting: Certified Nurse Midwife

## 2019-09-07 VITALS — BP 102/68 | HR 98 | Wt 237.2 lb

## 2019-09-07 DIAGNOSIS — Z3493 Encounter for supervision of normal pregnancy, unspecified, third trimester: Secondary | ICD-10-CM

## 2019-09-07 DIAGNOSIS — Z3403 Encounter for supervision of normal first pregnancy, third trimester: Secondary | ICD-10-CM

## 2019-09-07 LAB — POCT URINALYSIS DIPSTICK OB
Bilirubin, UA: NEGATIVE
Blood, UA: NEGATIVE
Glucose, UA: NEGATIVE
Ketones, UA: NEGATIVE
Leukocytes, UA: NEGATIVE
Nitrite, UA: NEGATIVE
POC,PROTEIN,UA: NEGATIVE
Spec Grav, UA: 1.01 (ref 1.010–1.025)
Urobilinogen, UA: 0.2 E.U./dL
pH, UA: 5 (ref 5.0–8.0)

## 2019-09-07 NOTE — Patient Instructions (Signed)
Braxton Hicks Contractions Contractions of the uterus can occur throughout pregnancy, but they are not always a sign that you are in labor. You may have practice contractions called Braxton Hicks contractions. These false labor contractions are sometimes confused with true labor. What are Braxton Hicks contractions? Braxton Hicks contractions are tightening movements that occur in the muscles of the uterus before labor. Unlike true labor contractions, these contractions do not result in opening (dilation) and thinning of the cervix. Toward the end of pregnancy (32-34 weeks), Braxton Hicks contractions can happen more often and may become stronger. These contractions are sometimes difficult to tell apart from true labor because they can be very uncomfortable. You should not feel embarrassed if you go to the hospital with false labor. Sometimes, the only way to tell if you are in true labor is for your health care provider to look for changes in the cervix. The health care provider will do a physical exam and may monitor your contractions. If you are not in true labor, the exam should show that your cervix is not dilating and your water has not broken. If there are no other health problems associated with your pregnancy, it is completely safe for you to be sent home with false labor. You may continue to have Braxton Hicks contractions until you go into true labor. How to tell the difference between true labor and false labor True labor  Contractions last 30-70 seconds.  Contractions become very regular.  Discomfort is usually felt in the top of the uterus, and it spreads to the lower abdomen and low back.  Contractions do not go away with walking.  Contractions usually become more intense and increase in frequency.  The cervix dilates and gets thinner. False labor  Contractions are usually shorter and not as strong as true labor contractions.  Contractions are usually irregular.  Contractions  are often felt in the front of the lower abdomen and in the groin.  Contractions may go away when you walk around or change positions while lying down.  Contractions get weaker and are shorter-lasting as time goes on.  The cervix usually does not dilate or become thin. Follow these instructions at home:   Take over-the-counter and prescription medicines only as told by your health care provider.  Keep up with your usual exercises and follow other instructions from your health care provider.  Eat and drink lightly if you think you are going into labor.  If Braxton Hicks contractions are making you uncomfortable: ? Change your position from lying down or resting to walking, or change from walking to resting. ? Sit and rest in a tub of warm water. ? Drink enough fluid to keep your urine pale yellow. Dehydration may cause these contractions. ? Do slow and deep breathing several times an hour.  Keep all follow-up prenatal visits as told by your health care provider. This is important. Contact a health care provider if:  You have a fever.  You have continuous pain in your abdomen. Get help right away if:  Your contractions become stronger, more regular, and closer together.  You have fluid leaking or gushing from your vagina.  You pass blood-tinged mucus (bloody show).  You have bleeding from your vagina.  You have low back pain that you never had before.  You feel your baby's head pushing down and causing pelvic pressure.  Your baby is not moving inside you as much as it used to. Summary  Contractions that occur before labor are   called Braxton Hicks contractions, false labor, or practice contractions.  Braxton Hicks contractions are usually shorter, weaker, farther apart, and less regular than true labor contractions. True labor contractions usually become progressively stronger and regular, and they become more frequent.  Manage discomfort from Braxton Hicks contractions  by changing position, resting in a warm bath, drinking plenty of water, or practicing deep breathing. This information is not intended to replace advice given to you by your health care provider. Make sure you discuss any questions you have with your health care provider. Document Released: 02/06/2017 Document Revised: 09/05/2017 Document Reviewed: 02/06/2017 Elsevier Patient Education  2020 Elsevier Inc.  

## 2019-09-07 NOTE — Progress Notes (Signed)
ROB doing well. Feels good movement. Discussed fetal movement in relationship to well being. Monitoring through the day. Discussed labor precautions. Plan for SVE next visit. She verbalizes and agrees to plan. Follow up 1 wk.   Philip Aspen, CNM

## 2019-09-16 ENCOUNTER — Telehealth: Payer: Self-pay

## 2019-09-16 ENCOUNTER — Telehealth: Payer: Self-pay | Admitting: Certified Nurse Midwife

## 2019-09-16 NOTE — Telephone Encounter (Signed)
The patient called and stated that she has been having contractions 20 minutes apart for an hour. Pt is requesting a call back to confirm things are okay and what to do next. Please advise.

## 2019-09-16 NOTE — Telephone Encounter (Signed)
mychart message sent

## 2019-09-20 ENCOUNTER — Ambulatory Visit (INDEPENDENT_AMBULATORY_CARE_PROVIDER_SITE_OTHER): Admitting: Certified Nurse Midwife

## 2019-09-20 ENCOUNTER — Other Ambulatory Visit: Payer: Self-pay

## 2019-09-20 VITALS — BP 99/66 | HR 82 | Wt 241.1 lb

## 2019-09-20 DIAGNOSIS — Z3403 Encounter for supervision of normal first pregnancy, third trimester: Secondary | ICD-10-CM

## 2019-09-20 LAB — POCT URINALYSIS DIPSTICK OB
Bilirubin, UA: NEGATIVE
Blood, UA: NEGATIVE
Glucose, UA: NEGATIVE
Ketones, UA: NEGATIVE
Leukocytes, UA: NEGATIVE
Nitrite, UA: NEGATIVE
POC,PROTEIN,UA: NEGATIVE
Spec Grav, UA: 1.025
Urobilinogen, UA: 0.2 U/dL
pH, UA: 6

## 2019-09-20 NOTE — Progress Notes (Signed)
Rob doing well. Feels good movement. Discussed induction at 41 wks. .SVE long/high/posterior. 1cm. U/s this Thursday or Friday for growth/AFI . PT verbalizes understanding. Labor precautions reviewed.   Philip Aspen, CNM

## 2019-09-20 NOTE — Addendum Note (Signed)
Addended by: Raliegh Ip on: 09/20/2019 11:33 AM   Modules accepted: Orders

## 2019-09-20 NOTE — Patient Instructions (Signed)
Braxton Hicks Contractions Contractions of the uterus can occur throughout pregnancy, but they are not always a sign that you are in labor. You may have practice contractions called Braxton Hicks contractions. These false labor contractions are sometimes confused with true labor. What are Braxton Hicks contractions? Braxton Hicks contractions are tightening movements that occur in the muscles of the uterus before labor. Unlike true labor contractions, these contractions do not result in opening (dilation) and thinning of the cervix. Toward the end of pregnancy (32-34 weeks), Braxton Hicks contractions can happen more often and may become stronger. These contractions are sometimes difficult to tell apart from true labor because they can be very uncomfortable. You should not feel embarrassed if you go to the hospital with false labor. Sometimes, the only way to tell if you are in true labor is for your health care provider to look for changes in the cervix. The health care provider will do a physical exam and may monitor your contractions. If you are not in true labor, the exam should show that your cervix is not dilating and your water has not broken. If there are no other health problems associated with your pregnancy, it is completely safe for you to be sent home with false labor. You may continue to have Braxton Hicks contractions until you go into true labor. How to tell the difference between true labor and false labor True labor  Contractions last 30-70 seconds.  Contractions become very regular.  Discomfort is usually felt in the top of the uterus, and it spreads to the lower abdomen and low back.  Contractions do not go away with walking.  Contractions usually become more intense and increase in frequency.  The cervix dilates and gets thinner. False labor  Contractions are usually shorter and not as strong as true labor contractions.  Contractions are usually irregular.  Contractions  are often felt in the front of the lower abdomen and in the groin.  Contractions may go away when you walk around or change positions while lying down.  Contractions get weaker and are shorter-lasting as time goes on.  The cervix usually does not dilate or become thin. Follow these instructions at home:   Take over-the-counter and prescription medicines only as told by your health care provider.  Keep up with your usual exercises and follow other instructions from your health care provider.  Eat and drink lightly if you think you are going into labor.  If Braxton Hicks contractions are making you uncomfortable: ? Change your position from lying down or resting to walking, or change from walking to resting. ? Sit and rest in a tub of warm water. ? Drink enough fluid to keep your urine pale yellow. Dehydration may cause these contractions. ? Do slow and deep breathing several times an hour.  Keep all follow-up prenatal visits as told by your health care provider. This is important. Contact a health care provider if:  You have a fever.  You have continuous pain in your abdomen. Get help right away if:  Your contractions become stronger, more regular, and closer together.  You have fluid leaking or gushing from your vagina.  You pass blood-tinged mucus (bloody show).  You have bleeding from your vagina.  You have low back pain that you never had before.  You feel your baby's head pushing down and causing pelvic pressure.  Your baby is not moving inside you as much as it used to. Summary  Contractions that occur before labor are   called Braxton Hicks contractions, false labor, or practice contractions.  Braxton Hicks contractions are usually shorter, weaker, farther apart, and less regular than true labor contractions. True labor contractions usually become progressively stronger and regular, and they become more frequent.  Manage discomfort from Braxton Hicks contractions  by changing position, resting in a warm bath, drinking plenty of water, or practicing deep breathing. This information is not intended to replace advice given to you by your health care provider. Make sure you discuss any questions you have with your health care provider. Document Released: 02/06/2017 Document Revised: 09/05/2017 Document Reviewed: 02/06/2017 Elsevier Patient Education  2020 Elsevier Inc.  

## 2019-09-23 ENCOUNTER — Other Ambulatory Visit

## 2019-09-27 ENCOUNTER — Encounter: Admitting: Certified Nurse Midwife

## 2019-09-27 ENCOUNTER — Other Ambulatory Visit (INDEPENDENT_AMBULATORY_CARE_PROVIDER_SITE_OTHER): Admitting: Certified Nurse Midwife

## 2019-09-27 DIAGNOSIS — Z3403 Encounter for supervision of normal first pregnancy, third trimester: Secondary | ICD-10-CM

## 2019-09-27 NOTE — Progress Notes (Signed)
Orders placed for induction .   Jadamarie Butson, CNM 

## 2019-09-28 ENCOUNTER — Other Ambulatory Visit: Payer: Self-pay

## 2019-09-28 ENCOUNTER — Ambulatory Visit (INDEPENDENT_AMBULATORY_CARE_PROVIDER_SITE_OTHER): Admitting: Certified Nurse Midwife

## 2019-09-28 ENCOUNTER — Ambulatory Visit (INDEPENDENT_AMBULATORY_CARE_PROVIDER_SITE_OTHER)

## 2019-09-28 ENCOUNTER — Encounter: Payer: Self-pay | Admitting: Certified Nurse Midwife

## 2019-09-28 VITALS — BP 103/68 | HR 91 | Wt 242.6 lb

## 2019-09-28 DIAGNOSIS — Z362 Encounter for other antenatal screening follow-up: Secondary | ICD-10-CM | POA: Diagnosis not present

## 2019-09-28 DIAGNOSIS — Z3A39 39 weeks gestation of pregnancy: Secondary | ICD-10-CM

## 2019-09-28 DIAGNOSIS — Z3403 Encounter for supervision of normal first pregnancy, third trimester: Secondary | ICD-10-CM

## 2019-09-28 LAB — POCT URINALYSIS DIPSTICK OB
Bilirubin, UA: NEGATIVE
Blood, UA: NEGATIVE
Glucose, UA: NEGATIVE
Ketones, UA: NEGATIVE
Leukocytes, UA: NEGATIVE
Nitrite, UA: NEGATIVE
POC,PROTEIN,UA: NEGATIVE
Spec Grav, UA: <=1.005 — AB (ref 1.010–1.025)
Urobilinogen, UA: 0.2 E.U./dL
pH, UA: 5 (ref 5.0–8.0)

## 2019-09-28 NOTE — Progress Notes (Signed)
Rob doing well. Feels good movement. U/s today for growth/AFI ( see below). Inductions scheduled. Reviewed process with pt. Discussed use of foley bulb due to cervix not favorable. Pt verbalizes understanding. Discussed covid testing. Plans to go tomorrow. Follow up as scheduled.   Patient Name: Audrey Short DOB: 10-Jan-1991 MRN: [id number] ULTRASOUND REPORT  Location: [facility] Date of Service: 09/28/2019   Indications:growth/afi Findings:  Nelda Marseille intrauterine pregnancy is visualized with FHR at 149 BPM. Biometrics give an (U/S) Gestational age of [redacted]w[redacted]d and an (U/S) EDD of 10/10/2019; this correlates with the clinically established Estimated Date of Delivery: 09/29/19.  Fetal presentation is Cephalic.  Placenta: anterior. Grade: 2 AFI: 8.4 cm  Growth percentile is 54. EFW: 3505 g ( 7lbs 12 oz)   Impression: 1. [redacted]w[redacted]d Viable [place] Intrauterine pregnancy previously established criteria. 2. Growth is 54 %ile.  AFI is 8.4 cm.   Recommendations: 1.Clinical correlation with the patient's History and Physical Exam.   Jenine  M. Albertine Grates     RDMS

## 2019-09-28 NOTE — Patient Instructions (Signed)
Braxton Hicks Contractions Contractions of the uterus can occur throughout pregnancy, but they are not always a sign that you are in labor. You may have practice contractions called Braxton Hicks contractions. These false labor contractions are sometimes confused with true labor. What are Braxton Hicks contractions? Braxton Hicks contractions are tightening movements that occur in the muscles of the uterus before labor. Unlike true labor contractions, these contractions do not result in opening (dilation) and thinning of the cervix. Toward the end of pregnancy (32-34 weeks), Braxton Hicks contractions can happen more often and may become stronger. These contractions are sometimes difficult to tell apart from true labor because they can be very uncomfortable. You should not feel embarrassed if you go to the hospital with false labor. Sometimes, the only way to tell if you are in true labor is for your health care provider to look for changes in the cervix. The health care provider will do a physical exam and may monitor your contractions. If you are not in true labor, the exam should show that your cervix is not dilating and your water has not broken. If there are no other health problems associated with your pregnancy, it is completely safe for you to be sent home with false labor. You may continue to have Braxton Hicks contractions until you go into true labor. How to tell the difference between true labor and false labor True labor  Contractions last 30-70 seconds.  Contractions become very regular.  Discomfort is usually felt in the top of the uterus, and it spreads to the lower abdomen and low back.  Contractions do not go away with walking.  Contractions usually become more intense and increase in frequency.  The cervix dilates and gets thinner. False labor  Contractions are usually shorter and not as strong as true labor contractions.  Contractions are usually irregular.  Contractions  are often felt in the front of the lower abdomen and in the groin.  Contractions may go away when you walk around or change positions while lying down.  Contractions get weaker and are shorter-lasting as time goes on.  The cervix usually does not dilate or become thin. Follow these instructions at home:   Take over-the-counter and prescription medicines only as told by your health care provider.  Keep up with your usual exercises and follow other instructions from your health care provider.  Eat and drink lightly if you think you are going into labor.  If Braxton Hicks contractions are making you uncomfortable: ? Change your position from lying down or resting to walking, or change from walking to resting. ? Sit and rest in a tub of warm water. ? Drink enough fluid to keep your urine pale yellow. Dehydration may cause these contractions. ? Do slow and deep breathing several times an hour.  Keep all follow-up prenatal visits as told by your health care provider. This is important. Contact a health care provider if:  You have a fever.  You have continuous pain in your abdomen. Get help right away if:  Your contractions become stronger, more regular, and closer together.  You have fluid leaking or gushing from your vagina.  You pass blood-tinged mucus (bloody show).  You have bleeding from your vagina.  You have low back pain that you never had before.  You feel your baby's head pushing down and causing pelvic pressure.  Your baby is not moving inside you as much as it used to. Summary  Contractions that occur before labor are   called Braxton Hicks contractions, false labor, or practice contractions.  Braxton Hicks contractions are usually shorter, weaker, farther apart, and less regular than true labor contractions. True labor contractions usually become progressively stronger and regular, and they become more frequent.  Manage discomfort from Braxton Hicks contractions  by changing position, resting in a warm bath, drinking plenty of water, or practicing deep breathing. This information is not intended to replace advice given to you by your health care provider. Make sure you discuss any questions you have with your health care provider. Document Released: 02/06/2017 Document Revised: 09/05/2017 Document Reviewed: 02/06/2017 Elsevier Patient Education  2020 Elsevier Inc.  

## 2019-09-29 ENCOUNTER — Inpatient Hospital Stay: Admit: 2019-09-29 | Payer: Self-pay

## 2019-09-29 ENCOUNTER — Ambulatory Visit: Attending: Internal Medicine

## 2019-09-29 DIAGNOSIS — Z20822 Contact with and (suspected) exposure to covid-19: Secondary | ICD-10-CM

## 2019-09-30 LAB — NOVEL CORONAVIRUS, NAA: SARS-CoV-2, NAA: NOT DETECTED

## 2019-10-03 ENCOUNTER — Other Ambulatory Visit: Payer: Self-pay

## 2019-10-03 ENCOUNTER — Inpatient Hospital Stay: Admitting: Anesthesiology

## 2019-10-03 ENCOUNTER — Inpatient Hospital Stay
Admission: EM | Admit: 2019-10-03 | Discharge: 2019-10-05 | DRG: 807 | Disposition: A | Discharge status: Home / Self Care | Attending: Certified Nurse Midwife | Admitting: Certified Nurse Midwife

## 2019-10-03 ENCOUNTER — Encounter: Payer: Self-pay | Admitting: Certified Nurse Midwife

## 2019-10-03 DIAGNOSIS — O480 Post-term pregnancy: Secondary | ICD-10-CM | POA: Diagnosis present

## 2019-10-03 DIAGNOSIS — Z3A40 40 weeks gestation of pregnancy: Secondary | ICD-10-CM | POA: Diagnosis not present

## 2019-10-03 LAB — CBC
HCT: 34.7 % — ABNORMAL LOW (ref 36.0–46.0)
Hemoglobin: 12.2 g/dL (ref 12.0–15.0)
MCH: 30.6 pg (ref 26.0–34.0)
MCHC: 35.2 g/dL (ref 30.0–36.0)
MCV: 87 fL (ref 80.0–100.0)
Platelets: 188 10*3/uL (ref 150–400)
RBC: 3.99 MIL/uL (ref 3.87–5.11)
RDW: 13.2 % (ref 11.5–15.5)
WBC: 13.3 10*3/uL — ABNORMAL HIGH (ref 4.0–10.5)
nRBC: 0 % (ref 0.0–0.2)

## 2019-10-03 LAB — RAPID HIV SCREEN (HIV 1/2 AB+AG)
HIV 1/2 Antibodies: NONREACTIVE
HIV-1 P24 Antigen - HIV24: NONREACTIVE

## 2019-10-03 LAB — TYPE AND SCREEN
ABO/RH(D): O POS
Antibody Screen: NEGATIVE

## 2019-10-03 MED ORDER — PHENYLEPHRINE 40 MCG/ML (10ML) SYRINGE FOR IV PUSH (FOR BLOOD PRESSURE SUPPORT)
80.0000 ug | PREFILLED_SYRINGE | INTRAVENOUS | Status: DC | PRN
  Filled 2019-10-03: qty 10

## 2019-10-03 MED ORDER — LIDOCAINE HCL (PF) 1 % IJ SOLN
INTRAMUSCULAR | Status: AC
  Filled 2019-10-03: qty 30

## 2019-10-03 MED ORDER — OXYTOCIN 40 UNITS IN NORMAL SALINE INFUSION - SIMPLE MED
1.0000 m[IU]/min | INTRAVENOUS | Status: DC
  Administered 2019-10-03: 2 m[IU]/min via INTRAVENOUS
  Filled 2019-10-03: qty 1000

## 2019-10-03 MED ORDER — SODIUM CHLORIDE 0.9 % IV SOLN
INTRAVENOUS | Status: DC | PRN
  Administered 2019-10-03 (×2): 5 mL via EPIDURAL

## 2019-10-03 MED ORDER — FENTANYL 2.5 MCG/ML W/ROPIVACAINE 0.15% IN NS 100 ML EPIDURAL (ARMC)
12.0000 mL/h | EPIDURAL | Status: DC
  Administered 2019-10-03 (×2): 12 mL/h via EPIDURAL
  Filled 2019-10-03: qty 100

## 2019-10-03 MED ORDER — DIPHENHYDRAMINE HCL 50 MG/ML IJ SOLN: 12.5000 mg | INTRAMUSCULAR | Status: DC | PRN

## 2019-10-03 MED ORDER — SOD CITRATE-CITRIC ACID 500-334 MG/5ML PO SOLN: 30.0000 mL | ORAL | Status: DC | PRN

## 2019-10-03 MED ORDER — AMMONIA AROMATIC IN INHA
RESPIRATORY_TRACT | Status: AC
  Filled 2019-10-03: qty 10

## 2019-10-03 MED ORDER — TERBUTALINE SULFATE 1 MG/ML IJ SOLN: 0.2500 mg | Freq: Once | INTRAMUSCULAR | Status: DC | PRN

## 2019-10-03 MED ORDER — ONDANSETRON HCL 4 MG/2ML IJ SOLN: 4.0000 mg | Freq: Four times a day (QID) | INTRAMUSCULAR | Status: DC | PRN

## 2019-10-03 MED ORDER — LIDOCAINE HCL (PF) 1 % IJ SOLN
30.0000 mL | INTRAMUSCULAR | Status: AC | PRN
  Administered 2019-10-03 (×3): 1 mL via SUBCUTANEOUS

## 2019-10-03 MED ORDER — LACTATED RINGERS IV SOLN
500.0000 mL | INTRAVENOUS | Status: DC | PRN
  Administered 2019-10-03 (×2): 500 mL via INTRAVENOUS

## 2019-10-03 MED ORDER — CALCIUM CARBONATE ANTACID 500 MG PO CHEW: 2.0000 | CHEWABLE_TABLET | ORAL | Status: DC | PRN

## 2019-10-03 MED ORDER — ACETAMINOPHEN 325 MG PO TABS
325.0000 mg | ORAL_TABLET | Freq: Once | ORAL | Status: AC
  Administered 2019-10-03: 20:00:00 325 mg via ORAL

## 2019-10-03 MED ORDER — EPHEDRINE 5 MG/ML INJ
10.0000 mg | INTRAVENOUS | Status: DC | PRN
  Filled 2019-10-03: qty 2

## 2019-10-03 MED ORDER — OXYTOCIN 40 UNITS IN NORMAL SALINE INFUSION - SIMPLE MED
2.5000 [IU]/h | INTRAVENOUS | Status: DC
  Administered 2019-10-03: 23:00:00 2.5 [IU]/h via INTRAVENOUS

## 2019-10-03 MED ORDER — MISOPROSTOL 100 MCG PO TABS
50.0000 ug | ORAL_TABLET | ORAL | Status: DC | PRN
  Administered 2019-10-03 (×2): 50 ug via VAGINAL
  Filled 2019-10-03 (×3): qty 1

## 2019-10-03 MED ORDER — LACTATED RINGERS IV SOLN: INTRAVENOUS | Status: DC

## 2019-10-03 MED ORDER — BUTORPHANOL TARTRATE 1 MG/ML IJ SOLN: 1.0000 mg | INTRAMUSCULAR | Status: DC | PRN

## 2019-10-03 MED ORDER — FENTANYL 2.5 MCG/ML W/ROPIVACAINE 0.15% IN NS 100 ML EPIDURAL (ARMC)
EPIDURAL | Status: AC
  Filled 2019-10-03: qty 100

## 2019-10-03 MED ORDER — OXYTOCIN 10 UNIT/ML IJ SOLN
INTRAMUSCULAR | Status: AC
  Filled 2019-10-03: qty 2

## 2019-10-03 MED ORDER — MISOPROSTOL 200 MCG PO TABS
ORAL_TABLET | ORAL | Status: AC
  Filled 2019-10-03: qty 4

## 2019-10-03 MED ORDER — LACTATED RINGERS IV SOLN
500.0000 mL | Freq: Once | INTRAVENOUS | Status: AC
  Administered 2019-10-03: 14:00:00 500 mL via INTRAVENOUS

## 2019-10-03 MED ORDER — IBUPROFEN 600 MG PO TABS
600.0000 mg | ORAL_TABLET | Freq: Four times a day (QID) | ORAL | Status: DC
  Administered 2019-10-04 – 2019-10-05 (×6): 600 mg via ORAL
  Filled 2019-10-03 (×6): qty 1

## 2019-10-03 MED ORDER — LIDOCAINE-EPINEPHRINE (PF) 1.5 %-1:200000 IJ SOLN
INTRAMUSCULAR | Status: DC | PRN
  Administered 2019-10-03: 3 mL via EPIDURAL

## 2019-10-03 MED ORDER — OXYTOCIN BOLUS FROM INFUSION
500.0000 mL | Freq: Once | INTRAVENOUS | Status: AC
  Administered 2019-10-03: 22:00:00 500 mL via INTRAVENOUS

## 2019-10-03 MED ORDER — ACETAMINOPHEN 325 MG PO TABS
650.0000 mg | ORAL_TABLET | ORAL | Status: DC | PRN
  Administered 2019-10-03 (×2): 650 mg via ORAL
  Filled 2019-10-03 (×3): qty 2

## 2019-10-03 NOTE — Progress Notes (Signed)
Spoke with provider about maternal fever and fetal tachycardia, verbal order to give 325mg  PO Tylenol in addition to 650mg  PO tylenol already given.

## 2019-10-03 NOTE — Progress Notes (Addendum)
LABOR NOTE   Audrey Short 28 y.o.@ at [redacted]w[redacted]d Active phase labor.  SUBJECTIVE:  Comfortable with epidural OBJECTIVE:  BP 125/66   Pulse 78   Temp 97.7 F (36.5 C) (Oral)   Resp 16   Ht 5\' 8"  (1.727 m)   Wt 109.8 kg   LMP 12/15/2018   SpO2 99%   BMI 36.80 kg/m  Total I/O In: 9 [I.V.:9] Out: -   She has shown cervical change. CERVIX: 6cm:  80%:   -2:   mid position:   soft SVE:   Dilation: 6 Effacement (%): 80 Station: -2 Exam by:: a Keaton Stirewalt cnm CONTRACTIONS: regular, every 2-4 minutes FHR: Fetal heart tracing reviewed. Baseline: 150 bpm, Variability: Good {> 6 bpm), Accelerations: Reactive and Decelerations: variable Category II   Analgesia: Epidural  Labs: Lab Results  Component Value Date   WBC 13.3 (H) 10/03/2019   HGB 12.2 10/03/2019   HCT 34.7 (L) 10/03/2019   MCV 87.0 10/03/2019   PLT 188 10/03/2019    ASSESSMENT: 1) Labor curve reviewed.       Progress: Active phase labor.     Membranes: ruptured, clear fluid           Active Problems:   Labor and delivery, indication for care   PLAN: continue present management, IV Pitocin induction and place IUPC not working, replaced . Amnioinfusion started for repetitive variable decelerations.   Improvement of decelerations noted after infusion started. Will continue to monitor.    Philip Aspen, CNM  10/03/2019 2:48 PM

## 2019-10-03 NOTE — Progress Notes (Signed)
LABOR NOTE   Audrey Short 28 y.o.@ at [redacted]w[redacted]d Active phase labor.  SUBJECTIVE:  Comfortable with epidural .  OBJECTIVE:  BP 124/75   Pulse 88   Temp 99.1 F (37.3 C) (Oral)   Resp 16   Ht 5\' 8"  (1.727 m)   Wt 109.8 kg   LMP 12/15/2018   SpO2 99%   BMI 36.80 kg/m  No intake/output data recorded.  She has shown cervical change. CERVIX: 10cm:  100%:   0:   mid position:    SVE:   Dilation: 10 Effacement (%): 100 Station: 0 Exam by:: A. Mavery Milling CNM CONTRACTIONS: regular, every 1-2 minutes FHR: Fetal heart tracing reviewed. Baseline: 150 bpm, Variability: Good {> 6 bpm), Accelerations: Reactive and Decelerations: Absent Category I   Analgesia: Epidural  Labs: Lab Results  Component Value Date   WBC 13.3 (H) 10/03/2019   HGB 12.2 10/03/2019   HCT 34.7 (L) 10/03/2019   MCV 87.0 10/03/2019   PLT 188 10/03/2019    ASSESSMENT: 1) Labor curve reviewed.       Progress: Active phase labor.     Membranes: ruptured, clear fluid         Active Problems:   Labor and delivery, indication for care   PLAN: Start pushing.   Philip Aspen, CNM  10/03/2019 9:39 PM

## 2019-10-03 NOTE — Progress Notes (Signed)
LABOR NOTE   Audrey Short 28 y.o.@ at [redacted]w[redacted]d Not in labor.  SUBJECTIVE:  Feels cramping OBJECTIVE:  BP (!) 110/59 (BP Location: Left Arm)   Pulse 66   Temp 98.1 F (36.7 C) (Oral)   Resp 18   Ht 5\' 8"  (1.727 m)   Wt 110 kg   LMP 12/15/2018   BMI 36.88 kg/m  No intake/output data recorded.  She has shown cervical change. CERVIX: 2cm: difficult to assess due to foley bulb placement. 2nd dose of cytotec placed.  SVE:   Dilation: 1 Effacement (%): 50 Station: -3, -2 Exam by:: Philip Aspen CNM CONTRACTIONS: regular, every 2-3 minutes FHR: Fetal heart tracing reviewed. Baseline: 145 bpm, Variability: Good {> 6 bpm), Accelerations: Reactive and Decelerations: Absent Category I   Analgesia: Labor support without medications  Labs: Lab Results  Component Value Date   WBC 13.3 (H) 10/03/2019   HGB 12.2 10/03/2019   HCT 34.7 (L) 10/03/2019   MCV 87.0 10/03/2019   PLT 188 10/03/2019    ASSESSMENT: 1) Labor curve reviewed.       Progress: Not in labor.     Membranes: intact       Active Problems:   Labor and delivery, indication for care   PLAN: continue present management   Philip Aspen, CNM  10/03/2019 7:02 AM

## 2019-10-03 NOTE — Progress Notes (Signed)
LABOR NOTE   Audrey Short 28 y.o.@ at [redacted]w[redacted]d in  labor   SUBJECTIVE:  Coping well with cramping.  OBJECTIVE:  BP 124/70   Pulse 80   Temp 98.3 F (36.8 C) (Oral)   Resp 18   Ht 5\' 8"  (1.727 m)   Wt 109.8 kg   LMP 12/15/2018   BMI 36.80 kg/m  No intake/output data recorded.  She has shown cervical change. CERVIX: 5 cm:  70%:   -2:   mid position:   soft SVE:   Dilation: 5 Effacement (%): 70 Station: -2 Exam by:: A Shellene Sweigert CONTRACTIONS: regular, every 1-3 minutes FHR: Fetal heart tracing reviewed. Baseline: 140 bpm, Variability: Good {> 6 bpm), Accelerations: Reactive and Decelerations: present, difficlut to determine, pt repostitioned Category II   Analgesia: Labor support without medications  Labs: Lab Results  Component Value Date   WBC 13.3 (H) 10/03/2019   HGB 12.2 10/03/2019   HCT 34.7 (L) 10/03/2019   MCV 87.0 10/03/2019   PLT 188 10/03/2019    ASSESSMENT: 1) Labor curve reviewed.       Progress: Early latent labor.     Membranes: ruptured, bloody      Active Problems:   Labor and delivery, indication for care   PLAN: continue present management and IV Pitocin augmentation if needed.   Philip Aspen, CNM  10/03/2019 10:40 AM

## 2019-10-03 NOTE — Progress Notes (Signed)
LABOR NOTE   Audrey Short 28 y.o.@ at [redacted]w[redacted]d Active phase labor.  SUBJECTIVE:  Comfortable with epidural OBJECTIVE:  BP (!) 117/59   Pulse 78   Temp 98.8 F (37.1 C) (Oral)   Resp 16   Ht 5\' 8"  (1.727 m)   Wt 109.8 kg   LMP 12/15/2018   SpO2 98%   BMI 36.80 kg/m  Total I/O In: 21 [I.V.:21] Out: -   She has shown cervical change. CERVIX: 7-8 cm:  90%:   -1:   mid position:   soft SVE:   Dilation: 7 Effacement (%): 90 Station: -1 Exam by:: A Lew Prout CNM CONTRACTIONS: regular, every 2-3 minutes FHR: Fetal heart tracing reviewed. Baseline: 140 bpm, Variability: Good {> 6 bpm), Accelerations: Reactive and Decelerations: variable Category II   Analgesia: Epidural  Labs: Lab Results  Component Value Date   WBC 13.3 (H) 10/03/2019   HGB 12.2 10/03/2019   HCT 34.7 (L) 10/03/2019   MCV 87.0 10/03/2019   PLT 188 10/03/2019    ASSESSMENT: 1) Labor curve reviewed.       Progress: Active phase labor.     Membranes: ruptured, clear fluid      Active Problems:   Labor and delivery, indication for care   PLAN: Amnioinfusion 200 ml/hr, pitocin is off, ctx adequate with MVUs 260-300.  Dr. Marcelline Mates updated on patient progress.   Philip Aspen, CNM  10/03/2019 5:58 PM

## 2019-10-03 NOTE — Anesthesia Preprocedure Evaluation (Signed)
Anesthesia Evaluation  Patient identified by MRN, date of birth, ID band Patient awake    Reviewed: Allergy & Precautions, H&P , NPO status , Patient's Chart, lab work & pertinent test results  History of Anesthesia Complications Negative for: history of anesthetic complications  Airway Mallampati: III  TM Distance: >3 FB Neck ROM: full    Dental  (+) Chipped   Pulmonary neg pulmonary ROS,           Cardiovascular Exercise Tolerance: Good (-) hypertensionnegative cardio ROS       Neuro/Psych    GI/Hepatic negative GI ROS,   Endo/Other    Renal/GU   negative genitourinary   Musculoskeletal   Abdominal   Peds  Hematology negative hematology ROS (+)   Anesthesia Other Findings Patient reports pre existing lumbar back pain  Past Medical History: No date: Allergy No date: IBS (irritable bowel syndrome) No date: Migraine  Past Surgical History: 03/15/14: CHOLECYSTECTOMY 2011: TONSILLECTOMY  BMI    Body Mass Index: 36.80 kg/m      Reproductive/Obstetrics (+) Pregnancy                             Anesthesia Physical Anesthesia Plan  ASA: II  Anesthesia Plan: Epidural   Post-op Pain Management:    Induction:   PONV Risk Score and Plan:   Airway Management Planned:   Additional Equipment:   Intra-op Plan:   Post-operative Plan:   Informed Consent: I have reviewed the patients History and Physical, chart, labs and discussed the procedure including the risks, benefits and alternatives for the proposed anesthesia with the patient or authorized representative who has indicated his/her understanding and acceptance.       Plan Discussed with: Anesthesiologist  Anesthesia Plan Comments:         Anesthesia Quick Evaluation

## 2019-10-03 NOTE — Progress Notes (Signed)
History and Physical   HPI  Audrey Short is a 28 y.o. G1P0000 at [redacted]w[redacted]d Estimated Date of Delivery: 09/29/19 who is being admitted for induction of labor.    OB History  OB History  Gravida Para Term Preterm AB Living  1 0 0 0 0 0  SAB TAB Ectopic Multiple Live Births  0 0 0 0 0    # Outcome Date GA Lbr Len/2nd Weight Sex Delivery Anes PTL Lv  1 Current             PROBLEM LIST  Pregnancy complications or risks: Patient Active Problem List   Diagnosis Date Noted  . Labor and delivery, indication for care 10/03/2019  . UTI (urinary tract infection) 03/08/2019  . Cholecystitis 02/22/2014    Prenatal labs and studies: ABO, Rh: --/--/O POS (12/27 0152) Antibody: NEG (12/27 0152) Rubella: 5.14 (05/28 0929) RPR: Non Reactive (09/29 0925)  HBsAg: Negative (05/28 0929)  HIV:    GBS:--/Negative (11/24 1644)   Past Medical History:  Diagnosis Date  . Allergy   . IBS (irritable bowel syndrome)   . Migraine      Past Surgical History:  Procedure Laterality Date  . CHOLECYSTECTOMY  03/15/14  . TONSILLECTOMY  2011     Medications    Current Discharge Medication List    CONTINUE these medications which have NOT CHANGED   Details  Prenatal Vit-Fe Fumarate-FA (PRENATAL MULTIVITAMIN) TABS tablet Take 1 tablet by mouth daily at 12 noon.         Allergies  Patient has no known allergies.  Review of Systems  Constitutional: negative Eyes: negative Ears, nose, mouth, throat, and face: negative Respiratory: negative Cardiovascular: negative Gastrointestinal: negative Genitourinary:negative Integument/breast: negative Hematologic/lymphatic: negative Musculoskeletal:negative Neurological: negative Behavioral/Psych: negative Endocrine: negative Allergic/Immunologic: negative  Physical Exam  BP 130/71 (BP Location: Right Arm)   Pulse 84   Temp 98.1 F (36.7 C) (Oral)   Resp 18   Ht 5\' 8"  (1.727 m)   Wt 110 kg   LMP 12/15/2018   BMI 36.88  kg/m   Lungs:  CTA B Cardio: RRR without M/R/G Abd: Soft, gravid, NT Presentation: cephalic EXT: No C/C/ 1+ Edema DTRs: 2+ B CERVIX: Dilation: 1 Effacement (%): 50 Exam by:: Philip Aspen CNM   See Prenatal records for more detailed PE.     FHR:  Baseline: 125 bpm, Variability: Good {> 6 bpm), Accelerations: Reactive and Decelerations: Absent  Toco: Uterine Contractions: None    Test Results  Results for orders placed or performed during the hospital encounter of 10/03/19 (from the past 24 hour(s))  CBC     Status: Abnormal   Collection Time: 10/03/19  1:52 AM  Result Value Ref Range   WBC 13.3 (H) 4.0 - 10.5 K/uL   RBC 3.99 3.87 - 5.11 MIL/uL   Hemoglobin 12.2 12.0 - 15.0 g/dL   HCT 34.7 (L) 36.0 - 46.0 %   MCV 87.0 80.0 - 100.0 fL   MCH 30.6 26.0 - 34.0 pg   MCHC 35.2 30.0 - 36.0 g/dL   RDW 13.2 11.5 - 15.5 %   Platelets 188 150 - 400 K/uL   nRBC 0.0 0.0 - 0.2 %  Type and screen     Status: None   Collection Time: 10/03/19  1:52 AM  Result Value Ref Range   ABO/RH(D) O POS    Antibody Screen NEG    Sample Expiration      10/06/2019,2359 Performed at [facility]  Lab, [address] [place], [place] [address]    Group B Strep negative  Assessment   G1P0000 at [redacted]w[redacted]d Estimated Date of Delivery: 09/29/19  The fetus is reassuring.    Patient Active Problem List   Diagnosis Date Noted  . Labor and delivery, indication for care 10/03/2019  . UTI (urinary tract infection) 03/08/2019  . Cholecystitis 02/22/2014    Plan  1. Admit to L&D :   Foley bulb placed, 1st dose cytotec placed.  2. EFM:-- Category 1 3. Epidural if desired.  Stadol for IV pain until epidural requested. 4. Admission labs  5.Foley bulb placed  Doreene Burke, [place]  10/03/2019 2:48 AM

## 2019-10-03 NOTE — Anesthesia Procedure Notes (Signed)
Epidural Patient location during procedure: OB Start time: 10/03/2019 2:07 PM End time: 10/03/2019 2:19 PM  Staffing Anesthesiologist: Chinedu Agustin, Precious Haws, MD Performed: anesthesiologist   Preanesthetic Checklist Completed: patient identified, IV checked, site marked, risks and benefits discussed, surgical consent, monitors and equipment checked, pre-op evaluation and timeout performed  Epidural Patient position: sitting Prep: ChloraPrep Patient monitoring: heart rate, continuous pulse ox and blood pressure Approach: midline Location: L3-L4 Injection technique: LOR saline  Needle:  Needle type: Tuohy  Needle gauge: 17 G Needle length: 9 cm and 9 Needle insertion depth: 9 cm Catheter type: closed end flexible Catheter size: 19 Gauge Catheter at skin depth: 15 cm Test dose: negative and 1.5% lidocaine with Epi 1:200 K  Assessment Sensory level: T10 Events: blood not aspirated, injection not painful, no injection resistance, no paresthesia and negative IV test  Additional Notes 3 attempts.  Transient right leg paresthesia with placement that resolved with needle retraction and redirection.  Pt. Evaluated and documentation done after procedure finished. Patient identified. Risks/Benefits/Options discussed with patient including but not limited to bleeding, infection, nerve damage, paralysis, failed block, incomplete pain control, headache, blood pressure changes, nausea, vomiting, reactions to medication both or allergic, itching and postpartum back pain. Confirmed with bedside nurse the patient's most recent platelet count. Confirmed with patient that they are not currently taking any anticoagulation, have any bleeding history or any family history of bleeding disorders. Patient expressed understanding and wished to proceed. All questions were answered. Sterile technique was used throughout the entire procedure. Please see nursing notes for vital signs. Test dose was given through  epidural catheter and negative prior to continuing to dose epidural or start infusion. Warning signs of high block given to the patient including shortness of breath, tingling/numbness in hands, complete motor block, or any concerning symptoms with instructions to call for help. Patient was given instructions on fall risk and not to get out of bed. All questions and concerns addressed with instructions to call with any issues or inadequate analgesia.   Patient tolerated the insertion well without immediate complications.Reason for block:procedure for pain

## 2019-10-04 LAB — CBC
HCT: 32.6 % — ABNORMAL LOW (ref 36.0–46.0)
Hemoglobin: 11 g/dL — ABNORMAL LOW (ref 12.0–15.0)
MCH: 30.8 pg (ref 26.0–34.0)
MCHC: 33.7 g/dL (ref 30.0–36.0)
MCV: 91.3 fL (ref 80.0–100.0)
Platelets: 151 10*3/uL (ref 150–400)
RBC: 3.57 MIL/uL — ABNORMAL LOW (ref 3.87–5.11)
RDW: 13.2 % (ref 11.5–15.5)
WBC: 24.5 10*3/uL — ABNORMAL HIGH (ref 4.0–10.5)
nRBC: 0 % (ref 0.0–0.2)

## 2019-10-04 LAB — RPR: RPR Ser Ql: NONREACTIVE

## 2019-10-04 MED ORDER — ACETAMINOPHEN 325 MG PO TABS: 650.0000 mg | ORAL_TABLET | ORAL | Status: DC | PRN

## 2019-10-04 MED ORDER — ONDANSETRON HCL 4 MG PO TABS: 4.0000 mg | ORAL_TABLET | ORAL | Status: DC | PRN

## 2019-10-04 MED ORDER — COCONUT OIL OIL
1.0000 "application " | TOPICAL_OIL | Status: DC | PRN
  Filled 2019-10-04: qty 120

## 2019-10-04 MED ORDER — METHYLERGONOVINE MALEATE 0.2 MG PO TABS: 0.2000 mg | ORAL_TABLET | ORAL | Status: DC | PRN

## 2019-10-04 MED ORDER — WITCH HAZEL-GLYCERIN EX PADS: 1.0000 "application " | MEDICATED_PAD | CUTANEOUS | Status: DC | PRN

## 2019-10-04 MED ORDER — PRENATAL MULTIVITAMIN CH
1.0000 | ORAL_TABLET | Freq: Every day | ORAL | Status: DC
  Administered 2019-10-04: 12:00:00 1 via ORAL
  Filled 2019-10-04: qty 1

## 2019-10-04 MED ORDER — SIMETHICONE 80 MG PO CHEW: 80.0000 mg | CHEWABLE_TABLET | ORAL | Status: DC | PRN

## 2019-10-04 MED ORDER — SENNOSIDES-DOCUSATE SODIUM 8.6-50 MG PO TABS
2.0000 | ORAL_TABLET | ORAL | Status: DC
  Administered 2019-10-05: 2 via ORAL
  Filled 2019-10-04: qty 2

## 2019-10-04 MED ORDER — DIBUCAINE (PERIANAL) 1 % EX OINT: 1.0000 "application " | TOPICAL_OINTMENT | CUTANEOUS | Status: DC | PRN

## 2019-10-04 MED ORDER — METHYLERGONOVINE MALEATE 0.2 MG/ML IJ SOLN: 0.2000 mg | INTRAMUSCULAR | Status: DC | PRN

## 2019-10-04 MED ORDER — FERROUS SULFATE 325 (65 FE) MG PO TABS
325.0000 mg | ORAL_TABLET | Freq: Every day | ORAL | Status: DC
  Administered 2019-10-04 – 2019-10-05 (×2): 325 mg via ORAL
  Filled 2019-10-04 (×2): qty 1

## 2019-10-04 MED ORDER — OXYCODONE-ACETAMINOPHEN 5-325 MG PO TABS: 2.0000 | ORAL_TABLET | ORAL | Status: DC | PRN

## 2019-10-04 MED ORDER — SENNOSIDES-DOCUSATE SODIUM 8.6-50 MG PO TABS
2.0000 | ORAL_TABLET | ORAL | Status: DC
  Filled 2019-10-04: qty 2

## 2019-10-04 MED ORDER — DOCUSATE SODIUM 100 MG PO CAPS
100.0000 mg | ORAL_CAPSULE | Freq: Two times a day (BID) | ORAL | Status: DC
  Administered 2019-10-04 – 2019-10-05 (×4): 100 mg via ORAL
  Filled 2019-10-04 (×4): qty 1

## 2019-10-04 MED ORDER — BENZOCAINE-MENTHOL 20-0.5 % EX AERO
1.0000 "application " | INHALATION_SPRAY | CUTANEOUS | Status: DC | PRN
  Administered 2019-10-04: 1 via TOPICAL
  Filled 2019-10-04: qty 56

## 2019-10-04 MED ORDER — ONDANSETRON HCL 4 MG/2ML IJ SOLN: 4.0000 mg | INTRAMUSCULAR | Status: DC | PRN

## 2019-10-04 MED ORDER — OXYCODONE-ACETAMINOPHEN 5-325 MG PO TABS: 1.0000 | ORAL_TABLET | ORAL | Status: DC | PRN

## 2019-10-04 NOTE — Progress Notes (Signed)
Post Partum Day # 1, s/p SVD  Subjective: no complaints, up ad lib, voiding and tolerating PO  Objective: Temp:  [97.7 F (36.5 C)-100.7 F (38.2 C)] 98.7 F (37.1 C) (12/28 0600) Pulse Rate:  [73-131] 88 (12/28 0900) Resp:  [16-20] 20 (12/28 0900) BP: (103-146)/(52-109) 114/62 (12/28 0900) SpO2:  [95 %-100 %] 98 % (12/28 0900)  Physical Exam:  General: alert and no distress  Lungs: clear to auscultation bilaterally Breasts: normal appearance, no masses or tenderness Heart: regular rate and rhythm, S1, S2 normal, no murmur, click, rub or gallop Abdomen: soft, non-tender; bowel sounds normal; no masses,  no organomegaly Pelvis: Lochia: appropriate, Uterine Fundus: firm Extremities: DVT Evaluation: No evidence of DVT seen on physical exam. Negative Homan's sign. No cords or calf tenderness. No significant calf/ankle edema.  Recent Labs    10/03/19 0152 10/04/19 0618  HGB 12.2 11.0*  HCT 34.7* 32.6*    Assessment/Plan: Doping well postpartum Breastfeeding, Lactation consult as needed Contraception progesterone-OCPs Plan for discharge tomorrow    LOS: 1 day   Audrey Maid, MD [facility] 10/04/2019 11:03 AM

## 2019-10-04 NOTE — Lactation Note (Signed)
This note was copied from a baby's chart. Lactation Consultation Note  Patient Name: Girl Kryssa Risenhoover Today's Date: 10/04/2019   In for follow-up visit before end of shift. Baby currently skin-to-skin after their bath.  Mother reports breastfeeding is going well and that she feels comfortable with plan overnight. Reports being able to position baby on her own into football position.  Discussed what to expect overnight with feeding and normal "cluster feeding" at this stage of life.  Denies any questions or concerns at this time. Offered assistance and follow-up tomorrow before discharge.   Maternal Data    Feeding Feeding Type: Breast Fed  LATCH Score                   Interventions    Lactation Tools Discussed/Used     Consult Status    Johnston Ebbs- NCAT Lactation Student   Lavonia Drafts 10/04/2019, 4:32 PM

## 2019-10-04 NOTE — Lactation Note (Signed)
This note was copied from a baby's chart. Lactation Consultation Note  Patient Name: Audrey Short [id number] Date: 10/04/2019 Reason for consult: Initial assessment  Called to room for initial visit. Baby "[name]" Kerney in cradle position when arrived. Offered to position baby into football hold on right side. Baby easily latched in this position. Swallowing noted. Mom reports feeling comfortable and denies any pinching or pain. Discussed proper sandwiching of breast to assist with latching. Explained desired flanged lips with latch and able to demonstrate with current latch.   Upon assessment, small, red bruise noted on left breast tissue. Mother reports this probably resulted from baby's first feed in life. Lactation brought coconut oil to room and discussed application and safe use.   No further questions or concerns at this time. Offered services and follow-up as needed.    Maternal Data Formula Feeding for Exclusion: No  Feeding Feeding Type: Breast Fed  LATCH Score Latch: Grasps breast easily, tongue down, lips flanged, rhythmical sucking.  Audible Swallowing: A few with stimulation  Type of Nipple: Everted at rest and after stimulation  Comfort (Breast/Nipple): Filling, red/small blisters or bruises, mild/mod discomfort  Hold (Positioning): Assistance needed to correctly position infant at breast and maintain latch.  LATCH Score: 7  Interventions Interventions: Breast feeding basics reviewed;Assisted with latch;Skin to skin;Breast massage;Hand express;Breast compression;Adjust position;Support pillows;Position options;Expressed milk;Coconut oil  Lactation Tools Discussed/Used  Coconut Oil    Consult Status Consult Status: Follow-up Date: 10/04/19 Follow-up type: In-patient   [place] Lactation Student  Lavonia Drafts 10/04/2019, 11:40 AM

## 2019-10-04 NOTE — Anesthesia Postprocedure Evaluation (Signed)
Anesthesia Post Note  Patient: Audrey Short  Procedure(s) Performed: AN AD HOC LABOR EPIDURAL  Patient location during evaluation: Mother Baby Anesthesia Type: Epidural Level of consciousness: awake and alert Pain management: pain level controlled Vital Signs Assessment: post-procedure vital signs reviewed and stable Respiratory status: spontaneous breathing, nonlabored ventilation and respiratory function stable Cardiovascular status: stable Postop Assessment: no headache, no backache and epidural receding Anesthetic complications: no     Last Vitals:  Vitals:   10/04/19 0230 10/04/19 0600  BP: 110/62 110/68  Pulse: 82 78  Resp: 20 20  Temp: 36.8 C 37.1 C  SpO2: 96% 95%    Last Pain:  Vitals:   10/04/19 0600  TempSrc: Oral  PainSc:                  Jerrye Noble

## 2019-10-05 MED ORDER — IBUPROFEN 800 MG PO TABS: 800.0000 mg | ORAL_TABLET | Freq: Three times a day (TID) | ORAL | 1 refills | Status: DC | PRN

## 2019-10-05 NOTE — Lactation Note (Signed)
This note was copied from a baby's chart. Lactation Consultation Note  Patient Name: Audrey Short [id number] Date: 10/05/2019 Reason for consult: Follow-up assessment  In for follow-up and discharge teaching. Baby Braun latched in football hold on left breast when arrived to room. Mother reports feeling comfortable with latch and denies any pain or pinching. Baby is latched deeply and intermittent swallows noted.   Mother reports that baby cluster fed overnight. Feels that  Baby is latching easier today and that they "have the hang of things now." Mother still notes some nipple soreness, but states the coconut oil is providing relief.   Discussed breastfeeding basics for at home care after discharge. Encouraged to continue 8-12 feeds in 24hrs, monitoring wet/dirty diapers, cluster feeding, and s/s of engorgement & mastitis. Mother states she has a pump at home. Discussed with her the indications for pumping. Mother shares she does not plan to return to work for 4 months.   Directed to [employer] number. Encouraged to call for any questions or concerns once home. No further questions or concerns at this time. Reports feeling comfortable with discharging and feeding plan at home.   Offered assistance while inpatient and requested mother call if needed lactation support prior to discharge.   Maternal Data Formula Feeding for Exclusion: No  Feeding Feeding Type: Breast Fed  LATCH Score Latch: Grasps breast easily, tongue down, lips flanged, rhythmical sucking.  Audible Swallowing: Spontaneous and intermittent  Type of Nipple: Everted at rest and after stimulation  Comfort (Breast/Nipple): Filling, red/small blisters or bruises, mild/mod discomfort(left breast )  Hold (Positioning): No assistance needed to correctly position infant at breast.  LATCH Score: 9  Interventions Interventions: Breast feeding basics reviewed;Pre-pump if needed;Hand express;Coconut  oil;Expressed milk  Lactation Tools Discussed/Used Pump Review: Setup, frequency, and cleaning;Milk Storage   Consult Status Consult Status: Complete Date: 10/05/19 Follow-up type: Call as needed   Johnston Ebbs -NCAT Lactation Student  Lavonia Drafts 10/05/2019, 10:06 AM

## 2019-10-05 NOTE — Discharge Summary (Signed)
OB Discharge Summary     Patient Name: Audrey Short DOB: 1991/04/12 MRN: [id number]  Date of admission: 10/03/2019 Delivering MD: Doreene Burke   Date of discharge: 10/05/2019  Admitting diagnosis: Labor and delivery, indication for care [O75.9] Intrauterine pregnancy: [redacted]w[redacted]d     Secondary diagnosis:  Active Problems:   Labor and delivery, indication for care  Post-dates pregnancy  Additional problems: None     Discharge diagnosis: Term Pregnancy Delivered                                                                                                Post partum procedures:None  Augmentation: AROM, Pitocin, Cytotec and Foley Balloon  Complications: None  Hospital course:  Induction of Labor With Vaginal Delivery   28 y.o. yo G1P0000 at [redacted]w[redacted]d was admitted to the hospital 10/03/2019 for induction of labor.  Indication for induction: Postdates.  Patient had an uncomplicated labor course as follows: Membrane Rupture Time/Date: 10:36 AM ,10/03/2019   Intrapartum Procedures: Episiotomy: None [1]                                         Lacerations:  Vaginal [6];2nd degree [3];Labial [10]  Patient had delivery of a Viable infant.  Information for the patient's newborn:  [place], Girl Jazzmyne [[id number]]  Delivery Method: Vag-Spont    10/03/2019  Details of delivery can be found in separate delivery note.  Patient had a routine postpartum course. Patient is discharged home 10/05/19.  Physical exam  Vitals:   10/04/19 0900 10/04/19 1240 10/04/19 1630 10/04/19 2213  BP: 114/62 122/63 (!) 123/53 108/63  Pulse: 88 90 70 65  Resp: 20 20 20 18   Temp:  98.9 F (37.2 C) (!) 97.5 F (36.4 C) (!) 97.4 F (36.3 C)  TempSrc:  Oral Oral Oral  SpO2: 98% 98%  98%  Weight:      Height:       General: alert, cooperative and no distress Lochia: appropriate Uterine Fundus: firm Incision: N/A DVT Evaluation: No evidence of DVT seen on physical exam. Negative Homan's sign. No cords or  calf tenderness. No significant calf/ankle edema.   Labs: Lab Results  Component Value Date   WBC 24.5 (H) 10/04/2019   HGB 11.0 (L) 10/04/2019   HCT 32.6 (L) 10/04/2019   MCV 91.3 10/04/2019   PLT 151 10/04/2019   CMP Latest Ref Rng & Units 06/05/2015  Glucose 70 - 99 mg/dL 75  BUN 6 - 23 mg/dL 7  Creatinine 06/07/2015 - 4.26 mg/dL 8.34  Sodium 1.96 - 222 mEq/L 140  Potassium 3.5 - 5.1 mEq/L 3.2(L)  Chloride 96 - 112 mEq/L 103  CO2 19 - 32 mEq/L 26  Calcium 8.4 - 10.5 mg/dL 9.5  Total Protein 6.0 - 8.3 g/dL 7.5  Total Bilirubin 0.2 - 1.2 mg/dL 0.5  Alkaline Phos 39 - 117 U/L 94  AST 0 - 37 U/L 39(H)  ALT 0 - 35 U/L 60(H)    Discharge instruction: per After Visit  Summary and "Baby and Me Booklet".  After visit meds:  Allergies as of 10/05/2019   No Known Allergies     Medication List    TAKE these medications   ibuprofen 800 MG tablet Commonly known as: ADVIL Take 1 tablet (800 mg total) by mouth every 8 (eight) hours as needed.   prenatal multivitamin Tabs tablet Take 1 tablet by mouth daily at 12 noon.       Diet: routine diet  Activity: Advance as tolerated. Pelvic rest for 6 weeks.   Outpatient follow up:6 weeks Follow up Appt: Future Appointments  Date Time Provider [place]  11/16/2019  9:45 AM Philip Aspen, CNM [facility] None   Follow up Visit:No follow-ups on file.  Postpartum contraception: Progesterone only pills  Newborn Data: Live born female  Birth Weight: 7 lb 0.2 oz (3180 g) APGAR: 8, 9  Newborn Delivery   Birth date/time: 10/03/2019 22:02:00 Delivery type: Vaginal, Spontaneous      Baby Feeding: Breast Disposition:home with mother   10/05/2019 Rubie Maid, MD  [facility]

## 2019-10-05 NOTE — Progress Notes (Signed)
Patient discharged home with infant. Discharge instructions, prescriptions and follow up appointment given to and reviewed with patient. Patient verbalized understanding. Patient wheeled out by RN with infant

## 2019-10-09 ENCOUNTER — Encounter: Payer: Self-pay | Admitting: Emergency Medicine

## 2019-10-09 ENCOUNTER — Emergency Department
Admission: EM | Admit: 2019-10-09 | Discharge: 2019-10-09 | Disposition: A | Discharge status: Home / Self Care | Attending: Emergency Medicine | Admitting: Emergency Medicine

## 2019-10-09 ENCOUNTER — Other Ambulatory Visit: Payer: Self-pay

## 2019-10-09 DIAGNOSIS — B349 Viral infection, unspecified: Secondary | ICD-10-CM

## 2019-10-09 DIAGNOSIS — R112 Nausea with vomiting, unspecified: Secondary | ICD-10-CM | POA: Diagnosis present

## 2019-10-09 DIAGNOSIS — Z20822 Contact with and (suspected) exposure to covid-19: Secondary | ICD-10-CM | POA: Diagnosis not present

## 2019-10-09 DIAGNOSIS — R197 Diarrhea, unspecified: Secondary | ICD-10-CM

## 2019-10-09 LAB — CBC WITH DIFFERENTIAL/PLATELET
Abs Immature Granulocytes: 0.18 10*3/uL — ABNORMAL HIGH (ref 0.00–0.07)
Basophils Absolute: 0.1 10*3/uL (ref 0.0–0.1)
Basophils Relative: 1 %
Eosinophils Absolute: 0.1 10*3/uL (ref 0.0–0.5)
Eosinophils Relative: 1 %
HCT: 37.5 % (ref 36.0–46.0)
Hemoglobin: 12.3 g/dL (ref 12.0–15.0)
Immature Granulocytes: 2 %
Lymphocytes Relative: 3 %
Lymphs Abs: 0.3 10*3/uL — ABNORMAL LOW (ref 0.7–4.0)
MCH: 29.5 pg (ref 26.0–34.0)
MCHC: 32.8 g/dL (ref 30.0–36.0)
MCV: 89.9 fL (ref 80.0–100.0)
Monocytes Absolute: 0.2 10*3/uL (ref 0.1–1.0)
Monocytes Relative: 2 %
Neutro Abs: 8.7 10*3/uL — ABNORMAL HIGH (ref 1.7–7.7)
Neutrophils Relative %: 91 %
Platelets: 251 10*3/uL (ref 150–400)
RBC: 4.17 MIL/uL (ref 3.87–5.11)
RDW: 12.7 % (ref 11.5–15.5)
WBC: 9.6 10*3/uL (ref 4.0–10.5)
nRBC: 0 % (ref 0.0–0.2)

## 2019-10-09 LAB — URINALYSIS, COMPLETE (UACMP) WITH MICROSCOPIC
Bacteria, UA: NONE SEEN
RBC / HPF: >50 RBC/hpf — ABNORMAL HIGH (ref 0–5)
Specific Gravity, Urine: 1.022 (ref 1.005–1.030)
WBC, UA: >50 WBC/hpf — ABNORMAL HIGH (ref 0–5)

## 2019-10-09 LAB — COMPREHENSIVE METABOLIC PANEL
ALT: 27 U/L (ref 0–44)
AST: 22 U/L (ref 15–41)
Albumin: 3.1 g/dL — ABNORMAL LOW (ref 3.5–5.0)
Alkaline Phosphatase: 93 U/L (ref 38–126)
Anion gap: 14 (ref 5–15)
BUN: 15 mg/dL (ref 6–20)
CO2: 18 mmol/L — ABNORMAL LOW (ref 22–32)
Calcium: 8.4 mg/dL — ABNORMAL LOW (ref 8.9–10.3)
Chloride: 105 mmol/L (ref 98–111)
Creatinine, Ser: 0.48 mg/dL (ref 0.44–1.00)
GFR calc Af Amer: >60 mL/min (ref >60)
GFR calc non Af Amer: >60 mL/min (ref >60)
Glucose, Bld: 103 mg/dL — ABNORMAL HIGH (ref 70–99)
Potassium: 3.8 mmol/L (ref 3.5–5.1)
Sodium: 137 mmol/L (ref 135–145)
Total Bilirubin: 0.9 mg/dL (ref 0.3–1.2)
Total Protein: 6.6 g/dL (ref 6.5–8.1)

## 2019-10-09 LAB — LACTIC ACID, PLASMA: Lactic Acid, Venous: 0.8 mmol/L (ref 0.5–1.9)

## 2019-10-09 LAB — POC SARS CORONAVIRUS 2 AG -  ED: SARS Coronavirus 2 Ag: NEGATIVE

## 2019-10-09 MED ORDER — ONDANSETRON 4 MG PO TBDP: 4.0000 mg | ORAL_TABLET | Freq: Four times a day (QID) | ORAL | 0 refills | Status: DC | PRN

## 2019-10-09 MED ORDER — ACETAMINOPHEN 500 MG PO TABS
1000.0000 mg | ORAL_TABLET | ORAL | Status: AC
  Administered 2019-10-09: 1000 mg via ORAL
  Filled 2019-10-09: qty 2

## 2019-10-09 MED ORDER — ONDANSETRON HCL 4 MG/2ML IJ SOLN
4.0000 mg | Freq: Once | INTRAMUSCULAR | Status: AC
  Administered 2019-10-09: 4 mg via INTRAVENOUS
  Filled 2019-10-09: qty 2

## 2019-10-09 MED ORDER — SODIUM CHLORIDE 0.9 % IV BOLUS
1000.0000 mL | Freq: Once | INTRAVENOUS | Status: AC
  Administered 2019-10-09: 09:00:00 1000 mL via INTRAVENOUS

## 2019-10-09 NOTE — ED Provider Notes (Signed)
[facility] Emergency Department Provider Note   ____________________________________________   First MD Initiated Contact with Patient 10/09/19 [phone]     (approximate)  I have reviewed the triage vital signs and the nursing notes.   HISTORY  Chief Complaint Fever and Emesis    HPI Audrey Short is a 29 y.o. female   delivered at full-term single female infant.  She was admitted for induction on December 27.  Patient had uncomplicated labor course.  Patient denies any recent illness, but this morning early she started to notice that she felt like she was becoming very nauseated, vomited several times and is continued to have loose stools and vomiting since this morning.  She is thrown up probably 5 times, nonblack nonbloody, and also had several loose watery stools.  She denies any vaginal discharge except for just a scant amount but does not have abnormal odor, and is improving since leaving the hospital.  She denies having any abdominal pain except for just a little bit of soreness that she says been present since the time she delivered.  She is not having any increasing pain or discomfort.  She had a fever of 100.4 at home.  She spoke with OB/GYN who recommended she come here for screening  No chest pain no cough no shortness of breath.  No sore throat no headache.  Reports all of her symptoms seem to be around nausea vomiting and diarrhea.  No other significant or severe symptoms.  Does not feel weak or lightheaded at this time.  And has not experienced any pain or burning with urination  Past Medical History:  Diagnosis Date  . Allergy   . IBS (irritable bowel syndrome)   . Migraine     Patient Active Problem List   Diagnosis Date Noted  . Labor and delivery, indication for care 10/03/2019  . UTI (urinary tract infection) 03/08/2019  . Cholecystitis 02/22/2014    Past Surgical History:  Procedure Laterality Date  . CHOLECYSTECTOMY  03/15/14  .  TONSILLECTOMY  2011    Prior to Admission medications   Medication Sig Start Date End Date Taking? Authorizing Provider  ibuprofen (ADVIL) 800 MG tablet Take 1 tablet (800 mg total) by mouth every 8 (eight) hours as needed. 10/05/19   Rubie Maid, MD  ondansetron (ZOFRAN ODT) 4 MG disintegrating tablet Take 1 tablet (4 mg total) by mouth every 6 (six) hours as needed. 10/09/19   Delman Kitten, MD  Prenatal Vit-Fe Fumarate-FA (PRENATAL MULTIVITAMIN) TABS tablet Take 1 tablet by mouth daily at 12 noon.    [provider]    Allergies Patient has no known allergies.  Family History  Problem Relation Age of Onset  . Depression Mother   . Heart disease Maternal Grandfather   . Hyperlipidemia Maternal Grandfather   . Hypertension Maternal Grandfather   . Depression Maternal Uncle   . Depression Maternal Grandmother   . Diabetes Neg Hx   . Stroke Neg Hx   . Cancer Neg Hx     Social History Social History   Tobacco Use  . Smoking status: Never Smoker  . Smokeless tobacco: Never Used  Substance Use Topics  . Alcohol use: Yes    Alcohol/week: 2.0 standard drinks    Types: 2 Cans of beer per week    Comment: occasional  . Drug use: No    Review of Systems Constitutional: Fevers and chills Eyes: No visual changes. ENT: No sore throat. Cardiovascular: Denies chest pain. Respiratory:  Denies shortness of breath. Gastrointestinal: No abdominal pain.  Reports nausea and vomiting but not pain.  Just a little discomfort since the time of her delivery cross the upper abdomen and this is been present since delivery Genitourinary: Negative for dysuria. Musculoskeletal: Negative for back pain. Skin: Negative for rash. Neurological: Negative for headaches, areas of focal weakness or numbness.    ____________________________________________   PHYSICAL EXAM:  VITAL SIGNS: ED Triage Vitals  Enc Vitals Group     BP 10/09/19 0819 130/69     Pulse Rate 10/09/19 0819 (!) 130      Resp 10/09/19 0819 18     Temp 10/09/19 0819 99.8 F (37.7 C)     Temp Source 10/09/19 0819 Oral     SpO2 10/09/19 0819 97 %     Weight 10/09/19 0820 220 lb (99.8 kg)     Height 10/09/19 0820 5\' 8"  (1.727 m)     Head Circumference --      Peak Flow --      Pain Score 10/09/19 0820 3     Pain Loc --      Pain Edu? --      Excl. in GC? --     Constitutional: Alert and oriented.  Mildly ill appearing and in no acute distress. Eyes: Conjunctivae are normal. Head: Atraumatic. Nose: No congestion/rhinnorhea. Mouth/Throat: Mucous membranes are slightly dry. Neck: No stridor.  Cardiovascular: Mildly tachycardic rate, regular rhythm. Grossly normal heart sounds.  Good peripheral circulation. Respiratory: Normal respiratory effort.  No retractions. Lungs CTAB. Gastrointestinal: Soft and nontender. No distention.  Denies pain to palpation in any quadrant.  No suprapubic tenderness. Musculoskeletal: No lower extremity tenderness nor edema.  No rashes.  No CVA tenderness to percussion bilateral. Neurologic:  Normal speech and language. No gross focal neurologic deficits are appreciated.  Skin:  Skin is warm, dry and intact. No rash noted. Psychiatric: Mood and affect are normal. Speech and behavior are normal.  ____________________________________________   LABS (all labs ordered are listed, but only abnormal results are displayed)  Labs Reviewed  CBC WITH DIFFERENTIAL/PLATELET - Abnormal; Notable for the following components:      Result Value   Neutro Abs 8.7 (*)    Lymphs Abs 0.3 (*)    Abs Immature Granulocytes 0.18 (*)    All other components within normal limits  COMPREHENSIVE METABOLIC PANEL - Abnormal; Notable for the following components:   CO2 18 (*)    Glucose, Bld 103 (*)    Calcium 8.4 (*)    Albumin 3.1 (*)    All other components within normal limits  URINALYSIS, COMPLETE (UACMP) WITH MICROSCOPIC - Abnormal; Notable for the following components:   Color, Urine RED  (*)    APPearance TURBID (*)    Glucose, UA   (*)    Value: TEST NOT REPORTED DUE TO COLOR INTERFERENCE OF URINE PIGMENT   Hgb urine dipstick   (*)    Value: TEST NOT REPORTED DUE TO COLOR INTERFERENCE OF URINE PIGMENT   Bilirubin Urine   (*)    Value: TEST NOT REPORTED DUE TO COLOR INTERFERENCE OF URINE PIGMENT   Ketones, ur   (*)    Value: TEST NOT REPORTED DUE TO COLOR INTERFERENCE OF URINE PIGMENT   Protein, ur   (*)    Value: TEST NOT REPORTED DUE TO COLOR INTERFERENCE OF URINE PIGMENT   Nitrite   (*)    Value: TEST NOT REPORTED DUE TO COLOR INTERFERENCE OF URINE PIGMENT  Leukocytes,Ua   (*)    Value: TEST NOT REPORTED DUE TO COLOR INTERFERENCE OF URINE PIGMENT   RBC / HPF >50 (*)    WBC, UA >50 (*)    All other components within normal limits  CULTURE, BLOOD (ROUTINE X 2)  CULTURE, BLOOD (ROUTINE X 2)  GI PATHOGEN PANEL BY PCR, STOOL  NOVEL CORONAVIRUS, NAA (HOSP ORDER, SEND-OUT TO REF LAB; TAT 18-24 HRS)  LACTIC ACID, PLASMA  POC SARS CORONAVIRUS 2 AG -  ED   ____________________________________________  EKG   ____________________________________________  RADIOLOGY    Chest imaging does not appear warranted.  No symptoms of pulmonary disease or chest pain.  No cough.  She did not undergo operative procedure such as intubation during her hospital course.  Normal oxygen saturation and lung sounds. ____________________________________________   PROCEDURES  Procedure(s) performed: None  Procedures  Critical Care performed: No  ____________________________________________   INITIAL IMPRESSION / ASSESSMENT AND PLAN / ED COURSE  Pertinent labs & imaging results that were available during my care of the patient were reviewed by me and considered in my medical decision making (see chart for details).   Patient presents with postpartum fever, she is now 3 days post discharge.  She appears to have symptoms of nausea vomiting diarrhea.  She does not have chest or  pulmonary symptoms.  No upper respiratory symptoms.  Denies lower abdominal pain, she does not appear to be having any foul-smelling lochia or breast discharge.  ----------------------------------------- 10:34 AM on 10/09/2019 -----------------------------------------  Have called [facility], discussed case with Dr. Logan Bores And reviewed results up to this time.  He recommends close outpatient follow-up and careful return precautions.  We discussed her symptoms, does not appear that she has symptoms consistent with endometritis.  Patient denies significant pain in the pelvic region, she is not having any increased discharge.  She does have associated nausea vomiting diarrhea.  Patient also tells me now that she got a call from her family who visited and one other family members also having similar symptoms today  Clinical Course as of Oct 09 1315  Sat Oct 09, 2019  1047 Clinical exam, history and lab work discussed with Dr. Logan Bores.  He advises the reassuring exam, no severe lower abdominal or pelvic pain, and no concerning vaginal bleeding that endometritis would be very low on the differential.  He advises treatment with conservative measures, antiemetics, and careful return precautions at this point.  I think this is agreeable, the patient feels much improved at this time she is resting comfortably, she is now tolerating fluids by mouth, heart rate has improved to 100, and she feels well without ongoing nausea or vomiting.  Discussed the patient her stool studies will not result for a couple of days as they are send out test.  She will follow-up closely with Doreene Burke her [facility]wife/[facility]   [MQ]    Clinical Course User Index [MQ] Sharyn Creamer, MD   Discussed with patient is comfortable with plan to monitor symptoms closely at home, will return to ER if worsening.  Understands that her final Covid test will return in about 3 days as well as her stool culture.  She has follow-up with her  midwife.  ____________________________________________   FINAL CLINICAL IMPRESSION(S) / ED DIAGNOSES  Final diagnoses:  Viral illness  Nausea vomiting and diarrhea        Note:  This document was prepared using Dragon voice recognition software and may include unintentional dictation errors  Sharyn Creamer, MD 10/09/19 1318

## 2019-10-09 NOTE — ED Triage Notes (Signed)
Presents with fever and vomiting which around 2 am  Recent PP 10/03/2019

## 2019-10-09 NOTE — Discharge Instructions (Addendum)
You have been seen in the Emergency Department (ED) today for a likely viral illness.  Please drink plenty of clear fluids (water, Gatorade, chicken broth, etc).  You may use Tylenol and/or Motrin according to label instructions.  You can alternate between the two without any side effects.   Please follow up with your doctor as listed above.  Call your doctor or return to the Emergency Department (ED) if you are unable to tolerate fluids due to vomiting, if you begin experiencing severe or persistent pain in your lower pelvis, he began to have bowel or increased bleeding or vaginal discharge, have worsening trouble breathing, become extremely tired or difficult to awaken, or if you develop any other symptoms that concern you.

## 2019-10-10 LAB — GI PATHOGEN PANEL BY PCR, STOOL

## 2019-10-11 LAB — NOVEL CORONAVIRUS, NAA (HOSP ORDER, SEND-OUT TO REF LAB; TAT 18-24 HRS): SARS-CoV-2, NAA: NOT DETECTED

## 2019-10-14 ENCOUNTER — Telehealth: Payer: Self-pay

## 2019-10-14 LAB — CULTURE, BLOOD (ROUTINE X 2)
Culture: NO GROWTH
Culture: NO GROWTH
Special Requests: ADEQUATE
Special Requests: ADEQUATE

## 2019-10-14 NOTE — Telephone Encounter (Signed)
mychart message sent regarding nipple cream- did she get it and is it working?

## 2019-10-26 ENCOUNTER — Ambulatory Visit: Payer: Self-pay

## 2019-10-26 NOTE — Lactation Note (Signed)
This note was copied from a baby's chart. `Lactation Consultation Note  Patient Name: Audrey Short Today's Date: 10/26/2019     Maternal Data  Mom initially experiencing sore nipples, a decrease in milk supply after recent stomach virus. Has a white film on nipples. Nipples are cracked. She states currently that nipples are not sore at the moment.  Feeding  [name] breastfed for approximately 30 minutes total. Weight was obtained before the feeding and was 3524 grams (7 lbs 13 ounces). Weight was obtained again after feeding on the left breast which showed [name] took 46 mL on the left side. Weight at the completion of the feeding was 3580 grams. [name] took 56 ML total this feeding which at almost 2 ounces.  LATCH Score                   Interventions    Lactation Tools Discussed/Used  Double Electric Breast pump, bottle and slow flow nipple   Consult Status  [name] and mom are here today for a lactation consultation. Mother is concerned about the decrease in her milk supply after getting sick 2 weeks ago. LC suggested that mother use all purpose nipple ointment to heal her nipples and prevent infection. Mother states she was already prescribed all purpose nipple ointment and will use on her nipples. Mother was allowed to pump after nursing [name] and pumped out 13 mL.   Lactation plan: Breastfeed [name] using pillows or a "My [place] Friend" pillow to achieve correct positioning ensuring that [name]'s ears, shoulders and hips are aligned. Try not to feed longer than 30 minutes. Pump for 15-20 minutes or until very little comes out after breastfeeding using initiation button during session.  Supplement with 1-2 ounces of formula and/or pumped breastmilk.  Use disposable breast pads and change pad each time after breastfeeding. Use a pea-sized amount of  All purpose nipple ointment after feedings as needed.  Take moringa supplement 2-3 times per day.  Mother [name]) will follow up to see how  lactation plan is going.      [name] 10/26/2019, 11:17 AM

## 2019-11-16 ENCOUNTER — Encounter: Payer: Self-pay | Admitting: Certified Nurse Midwife

## 2019-11-16 ENCOUNTER — Other Ambulatory Visit: Payer: Self-pay

## 2019-11-16 ENCOUNTER — Ambulatory Visit (INDEPENDENT_AMBULATORY_CARE_PROVIDER_SITE_OTHER): Admitting: Certified Nurse Midwife

## 2019-11-16 MED ORDER — DROSPIRENONE-ETHINYL ESTRADIOL 3-0.02 MG PO TABS: 1.0000 | ORAL_TABLET | Freq: Every day | ORAL | 11 refills | Status: DC

## 2019-11-16 NOTE — Progress Notes (Signed)
Subjective:    Audrey Short is a 29 y.o. G78P0000 Caucasian female who presents for a postpartum visit. She is 6 weeks postpartum following a spontaneous vaginal delivery at 40.4 gestational weeks. Anesthesia: epidural. I have fully reviewed the prenatal and intrapartum course. Postpartum course has been WNL. Baby's course has been WNL. Baby is feeding by bottle. Bleeding no bleeding. Bowel function is normal. Bladder function is normal. Patient is not sexually active.  Contraception method is OCP (estrogen/progesterone). Postpartum depression screening: negative. Score 0.  Last pap 02/02/19 and was negative.  The following portions of the patient's history were reviewed and updated as appropriate: allergies, current medications, past medical history, past surgical history and problem list.  Review of Systems Pertinent items are noted in HPI.   Vitals:   11/16/19 0946  BP: 98/67  Pulse: 64  Weight: 224 lb 1 oz (101.6 kg)  Height: 5\' 8"  (1.727 m)   No LMP recorded.  Objective:   General:  alert, cooperative and no distress   Breasts:  deferred, no complaints  Lungs: clear to auscultation bilaterally  Heart:  regular rate and rhythm  Abdomen: soft, nontender   Vulva: normal  Vagina: normal vagina  Cervix:  closed  Corpus: Well-involuted  Adnexa:  Non-palpable  Rectal Exam: no hemorrhoids        Assessment:   Postpartum exam 6 wks s/p SVD Bottle feeding Depression screening Contraception counseling   Plan:  : OCP (estrogen/progesterone) Follow up in: 9 months for annual exam  or earlier if needed  , CNM

## 2019-11-16 NOTE — Patient Instructions (Signed)
Preventive Care 21-29 Years Old, Female Preventive care refers to visits with your health care provider and lifestyle choices that can promote health and wellness. This includes:  A yearly physical exam. This may also be called an annual well check.  Regular dental visits and eye exams.  Immunizations.  Screening for certain conditions.  Healthy lifestyle choices, such as eating a healthy diet, getting regular exercise, not using drugs or products that contain nicotine and tobacco, and limiting alcohol use. What can I expect for my preventive care visit? Physical exam Your health care provider will check your:  Height and weight. This may be used to calculate body mass index (BMI), which tells if you are at a healthy weight.  Heart rate and blood pressure.  Skin for abnormal spots. Counseling Your health care provider may ask you questions about your:  Alcohol, tobacco, and drug use.  Emotional well-being.  Home and relationship well-being.  Sexual activity.  Eating habits.  Work and work environment.  Method of birth control.  Menstrual cycle.  Pregnancy history. What immunizations do I need?  Influenza (flu) vaccine  This is recommended every year. Tetanus, diphtheria, and pertussis (Tdap) vaccine  You may need a Td booster every 10 years. Varicella (chickenpox) vaccine  You may need this if you have not been vaccinated. Human papillomavirus (HPV) vaccine  If recommended by your health care provider, you may need three doses over 6 months. Measles, mumps, and rubella (MMR) vaccine  You may need at least one dose of MMR. You may also need a second dose. Meningococcal conjugate (MenACWY) vaccine  One dose is recommended if you are age 19-21 years and a first-year college student living in a residence hall, or if you have one of several medical conditions. You may also need additional booster doses. Pneumococcal conjugate (PCV13) vaccine  You may need  this if you have certain conditions and were not previously vaccinated. Pneumococcal polysaccharide (PPSV23) vaccine  You may need one or two doses if you smoke cigarettes or if you have certain conditions. Hepatitis A vaccine  You may need this if you have certain conditions or if you travel or work in places where you may be exposed to hepatitis A. Hepatitis B vaccine  You may need this if you have certain conditions or if you travel or work in places where you may be exposed to hepatitis B. Haemophilus influenzae type b (Hib) vaccine  You may need this if you have certain conditions. You may receive vaccines as individual doses or as more than one vaccine together in one shot (combination vaccines). Talk with your health care provider about the risks and benefits of combination vaccines. What tests do I need?  Blood tests  Lipid and cholesterol levels. These may be checked every 5 years starting at age 20.  Hepatitis C test.  Hepatitis B test. Screening  Diabetes screening. This is done by checking your blood sugar (glucose) after you have not eaten for a while (fasting).  Sexually transmitted disease (STD) testing.  BRCA-related cancer screening. This may be done if you have a family history of breast, ovarian, tubal, or peritoneal cancers.  Pelvic exam and Pap test. This may be done every 3 years starting at age 21. Starting at age 30, this may be done every 5 years if you have a Pap test in combination with an HPV test. Talk with your health care provider about your test results, treatment options, and if necessary, the need for more tests.   Follow these instructions at home: Eating and drinking   Eat a diet that includes fresh fruits and vegetables, whole grains, lean protein, and low-fat dairy.  Take vitamin and mineral supplements as recommended by your health care provider.  Do not drink alcohol if: ? Your health care provider tells you not to drink. ? You are  pregnant, may be pregnant, or are planning to become pregnant.  If you drink alcohol: ? Limit how much you have to 0-1 drink a day. ? Be aware of how much alcohol is in your drink. In the [place] one drink equals one 12 oz bottle of beer (355 mL), one 5 oz glass of wine (148 mL), or one 1 oz glass of hard liquor (44 mL). Lifestyle  Take daily care of your teeth and gums.  Stay active. Exercise for at least 30 minutes on 5 or more days each week.  Do not use any products that contain nicotine or tobacco, such as cigarettes, e-cigarettes, and chewing tobacco. If you need help quitting, ask your health care provider.  If you are sexually active, practice safe sex. Use a condom or other form of birth control (contraception) in order to prevent pregnancy and STIs (sexually transmitted infections). If you plan to become pregnant, see your health care provider for a preconception visit. What's next?  Visit your health care provider once a year for a well check visit.  Ask your health care provider how often you should have your eyes and teeth checked.  Stay up to date on all vaccines. This information is not intended to replace advice given to you by your health care provider. Make sure you discuss any questions you have with your health care provider. Document Revised: 06/04/2018 Document Reviewed: 06/04/2018 Elsevier Patient Education  2020 [employer].

## 2019-12-08 ENCOUNTER — Ambulatory Visit: Attending: Internal Medicine

## 2019-12-08 DIAGNOSIS — Z20822 Contact with and (suspected) exposure to covid-19: Secondary | ICD-10-CM

## 2019-12-09 LAB — NOVEL CORONAVIRUS, NAA: SARS-CoV-2, NAA: DETECTED — AB

## 2020-06-26 ENCOUNTER — Other Ambulatory Visit: Payer: Self-pay

## 2020-06-26 ENCOUNTER — Ambulatory Visit (INDEPENDENT_AMBULATORY_CARE_PROVIDER_SITE_OTHER): Admitting: Podiatry

## 2020-06-26 ENCOUNTER — Encounter: Payer: Self-pay | Admitting: Podiatry

## 2020-06-26 DIAGNOSIS — L600 Ingrowing nail: Secondary | ICD-10-CM | POA: Diagnosis not present

## 2020-06-26 MED ORDER — DOXYCYCLINE HYCLATE 100 MG PO TABS: 100.0000 mg | ORAL_TABLET | Freq: Two times a day (BID) | ORAL | 0 refills | Status: DC

## 2020-06-26 NOTE — Patient Instructions (Signed)

## 2020-06-27 ENCOUNTER — Encounter: Payer: Self-pay | Admitting: Podiatry

## 2020-06-27 NOTE — Progress Notes (Signed)
  Subjective:  Patient ID: Audrey Short, female    DOB: Oct 19, 1990,  MRN: [id number]  Chief Complaint  Patient presents with  . Nail Problem    Patient presents today for ingrown toenail right hallux bilat borders since last night    29 y.o. female presents with the above complaint.  Patient presents with complaint of right hallux medial/lateral border ingrown.  Patient states that is very painful.  Patient said there is some redness associated with it.  She has not taken any antibiotics.  She would like to have it removed.  She denies any other acute complaints.  She states that she has done some self debridement.  This could have made a little worse.  She denies any other acute complaints.   Review of Systems: Negative except as noted in the HPI. Denies N/V/F/Ch.  Past Medical History:  Diagnosis Date  . Allergy   . IBS (irritable bowel syndrome)   . Migraine     Current Outpatient Medications:  .  doxycycline (VIBRA-TABS) 100 MG tablet, Take 1 tablet (100 mg total) by mouth 2 (two) times daily., Disp: 20 tablet, Rfl: 0 .  drospirenone-ethinyl estradiol (YAZ) 3-0.02 MG tablet, Take 1 tablet by mouth daily., Disp: 1 Package, Rfl: 11  Social History   Tobacco Use  Smoking Status Never Smoker  Smokeless Tobacco Never Used    No Known Allergies Objective:  There were no vitals filed for this visit. There is no height or weight on file to calculate BMI. Constitutional Well developed. Well nourished.  Vascular Dorsalis pedis pulses palpable bilaterally. Posterior tibial pulses palpable bilaterally. Capillary refill normal to all digits.  No cyanosis or clubbing noted. Pedal hair growth normal.  Neurologic Normal speech. Oriented to person, place, and time. Epicritic sensation to light touch grossly present bilaterally.  Dermatologic Painful ingrowing nail at Medial lateral border nail borders of the hallux nail right. No other open wounds. No skin lesions.  Orthopedic:  Normal joint ROM without pain or crepitus bilaterally. No visible deformities. No bony tenderness.   Radiographs: None Assessment:   1. Ingrown toenail of right foot    Plan:  Patient was evaluated and treated and all questions answered.  Ingrown Nail, right -Patient elects to proceed with minor surgery to remove ingrown toenail removal today. Consent reviewed and signed by patient. -Ingrown nail excised. See procedure note. -Educated on post-procedure care including soaking. Written instructions provided and reviewed. -Patient to follow up in 2 weeks for nail check.  Procedure: Excision of Ingrown Toenail Location: Right 1st toe Both medial and lateral nail borders. Anesthesia: Lidocaine 1% plain; 1.5 mL and Marcaine 0.5% plain; 1.5 mL, digital block. Skin Prep: Betadine. Dressing: Silvadene; telfa; dry, sterile, compression dressing. Technique: Following skin prep, the toe was exsanguinated and a tourniquet was secured at the base of the toe. The affected nail border was freed, split with a nail splitter, and excised. Chemical matrixectomy was then performed with phenol and irrigated out with alcohol. The tourniquet was then removed and sterile dressing applied. Disposition: Patient tolerated procedure well. Patient to return in 2 weeks for follow-up.   No follow-ups on file.

## 2020-07-13 ENCOUNTER — Other Ambulatory Visit: Payer: Self-pay

## 2020-07-13 ENCOUNTER — Ambulatory Visit (INDEPENDENT_AMBULATORY_CARE_PROVIDER_SITE_OTHER): Admitting: Podiatry

## 2020-07-13 ENCOUNTER — Encounter: Payer: Self-pay | Admitting: Podiatry

## 2020-07-13 DIAGNOSIS — L600 Ingrowing nail: Secondary | ICD-10-CM | POA: Diagnosis not present

## 2020-07-13 NOTE — Progress Notes (Signed)
Subjective: Audrey Short is a 29 y.o.  female returns to office today for follow up evaluation after having right Hallux Medial, Lateral border nail avulsion performed. Patient has been soaking using epsom salt and applying topical antibiotic covered with bandaid daily. Patient denies fevers, chills, nausea, vomiting. Denies any calf pain, chest pain, SOB.   Objective:  Vitals: Reviewed  General: Well developed, nourished, in no acute distress, alert and oriented x3   Dermatology: Skin is warm, dry and supple bilateral. Medial, Lateral hallux nail border appears to be clean, dry, with mild granular tissue and surrounding scab. There is no surrounding erythema, edema, drainage/purulence. The remaining nails appear unremarkable at this time. There are no other lesions or other signs of infection present.  Neurovascular status: Intact. No lower extremity swelling; No pain with calf compression bilateral.  Musculoskeletal: Decreased tenderness to palpation of the Medial, Lateral hallux nail fold(s). Muscular strength within normal limits bilateral.   Assesement and Plan: S/p partial nail avulsion, doing well.   -Continue soaking in epsom salts twice a day followed by antibiotic ointment and a band-aid. Can leave uncovered at night. Continue this until completely healed.  -If the area has not healed in 2 weeks, call the office for follow-up appointment, or sooner if any problems arise.  -Monitor for any signs/symptoms of infection. Call the office immediately if any occur or go directly to the emergency room. Call with any questions/concerns.  Nicholes Rough, DPM

## 2020-07-17 ENCOUNTER — Ambulatory Visit (INDEPENDENT_AMBULATORY_CARE_PROVIDER_SITE_OTHER): Admitting: Certified Nurse Midwife

## 2020-07-17 ENCOUNTER — Other Ambulatory Visit: Payer: Self-pay

## 2020-07-17 ENCOUNTER — Encounter: Payer: Self-pay | Admitting: Certified Nurse Midwife

## 2020-07-17 VITALS — BP 132/82 | HR 69 | Ht 68.0 in | Wt 229.1 lb

## 2020-07-17 DIAGNOSIS — Z23 Encounter for immunization: Secondary | ICD-10-CM

## 2020-07-17 DIAGNOSIS — Z01419 Encounter for gynecological examination (general) (routine) without abnormal findings: Secondary | ICD-10-CM | POA: Diagnosis not present

## 2020-07-17 MED ORDER — DROSPIRENONE-ETHINYL ESTRADIOL 3-0.02 MG PO TABS: 1.0000 | ORAL_TABLET | Freq: Every day | ORAL | 11 refills | Status: DC

## 2020-07-17 NOTE — Progress Notes (Signed)
GYNECOLOGY ANNUAL PREVENTATIVE CARE ENCOUNTER NOTE  History:     Audrey Short is a 29 y.o. G27P0000 female here for a routine annual gynecologic exam.  Current complaints: none.   Denies abnormal vaginal bleeding, discharge, pelvic pain, problems with intercourse or other gynecologic concerns.     Social Relationship:Married Living:with partner and kid Work: Public librarian Exercise: walking and taking care of child Smoke/Alcohol/drug GLO:VFIE alcohol   Upstream - 07/17/20 1111      Pregnancy Intention Screening   Does the patient want to become pregnant in the next year? No    Does the patient's partner want to become pregnant in the next year? No    Would the patient like to discuss contraceptive options today? No      Contraception Wrap Up   Current Method Oral Contraceptive    Contraception Counseling Provided No          The pregnancy intention screening data noted above was reviewed. Potential methods of contraception were discussed. The patient elected to proceed with Oral Contraceptive.    Gynecologic History No LMP recorded. Contraception:yaz Last Pap: 07/27/19. Results were: normal with negative HPV Last mammogram: n/a   Obstetric History OB History  Gravida Para Term Preterm AB Living  1 0 0 0 0 0  SAB TAB Ectopic Multiple Live Births  0 0 0 0      # Outcome Date GA Lbr Len/2nd Weight Sex Delivery Anes PTL Lv  1 Gravida             Past Medical History:  Diagnosis Date  . Allergy   . IBS (irritable bowel syndrome)   . Migraine     Past Surgical History:  Procedure Laterality Date  . CHOLECYSTECTOMY  03/15/14  . TONSILLECTOMY  2011    Current Outpatient Medications on File Prior to Visit  Medication Sig Dispense Refill  . drospirenone-ethinyl estradiol (YAZ) 3-0.02 MG tablet Take 1 tablet by mouth daily. 1 Package 11   No current facility-administered medications on file prior to visit.    No Known Allergies  Social History:  reports  that she has never smoked. She has never used smokeless tobacco. She reports current alcohol use of about 2.0 standard drinks of alcohol per week. She reports that she does not use drugs.  Family History  Problem Relation Age of Onset  . Depression Mother   . Heart disease Maternal Grandfather   . Hyperlipidemia Maternal Grandfather   . Hypertension Maternal Grandfather   . Depression Maternal Uncle   . Depression Maternal Grandmother   . Diabetes Neg Hx   . Stroke Neg Hx   . Cancer Neg Hx     The following portions of the patient's history were reviewed and updated as appropriate: allergies, current medications, past family history, past medical history, past social history, past surgical history and problem list.  Review of Systems Pertinent items noted in HPI and remainder of comprehensive ROS otherwise negative.  Physical Exam:  There were no vitals taken for this visit. CONSTITUTIONAL: Well-developed, well-nourished female in no acute distress.  HENT:  Normocephalic, atraumatic, External right and left ear normal. Oropharynx is clear and moist EYES: Conjunctivae and EOM are normal. Pupils are equal, round, and reactive to light. No scleral icterus.  NECK: Normal range of motion, supple, no masses.  Normal thyroid.  SKIN: Skin is warm and dry. No rash noted. Not diaphoretic. No erythema. No pallor. MUSCULOSKELETAL: Normal range of motion. No tenderness.  No  cyanosis, clubbing, or edema.  2+ distal pulses. NEUROLOGIC: Alert and oriented to person, place, and time. Normal reflexes, muscle tone coordination.  PSYCHIATRIC: Normal mood and affect. Normal behavior. Normal judgment and thought content. CARDIOVASCULAR: Normal heart rate noted, regular rhythm RESPIRATORY: Clear to auscultation bilaterally. Effort and breath sounds normal, no problems with respiration noted. BREASTS: Symmetric in size. No masses, tenderness, skin changes, nipple drainage, or lymphadenopathy bilaterally.   ABDOMEN: Soft, no distention noted.  No tenderness, rebound or guarding.  PELVIC: Normal appearing external genitalia and urethral meatus; normal appearing vaginal mucosa and cervix.  No abnormal discharge noted.  Pap smear not indicated.  Normal uterine size, no other palpable masses, no uterine or adnexal tenderness.  .   Assessment and Plan:    1. Women's annual routine gynecological examination  Pap:not due Mammogram :n/a  Labs:declines, pt is moving will have done at new office Refills: ocp Referral:none Routine preventative health maintenance measures emphasized. Please refer to After Visit Summary for other counseling recommendations.      Doreene Burke, CNM [facility],  [facility]

## 2020-07-17 NOTE — Patient Instructions (Signed)
Preventive Care 21-29 Years Old, Female Preventive care refers to visits with your health care provider and lifestyle choices that can promote health and wellness. This includes:  A yearly physical exam. This may also be called an annual well check.  Regular dental visits and eye exams.  Immunizations.  Screening for certain conditions.  Healthy lifestyle choices, such as eating a healthy diet, getting regular exercise, not using drugs or products that contain nicotine and tobacco, and limiting alcohol use. What can I expect for my preventive care visit? Physical exam Your health care provider will check your:  Height and weight. This may be used to calculate body mass index (BMI), which tells if you are at a healthy weight.  Heart rate and blood pressure.  Skin for abnormal spots. Counseling Your health care provider may ask you questions about your:  Alcohol, tobacco, and drug use.  Emotional well-being.  Home and relationship well-being.  Sexual activity.  Eating habits.  Work and work environment.  Method of birth control.  Menstrual cycle.  Pregnancy history. What immunizations do I need?  Influenza (flu) vaccine  This is recommended every year. Tetanus, diphtheria, and pertussis (Tdap) vaccine  You may need a Td booster every 10 years. Varicella (chickenpox) vaccine  You may need this if you have not been vaccinated. Human papillomavirus (HPV) vaccine  If recommended by your health care provider, you may need three doses over 6 months. Measles, mumps, and rubella (MMR) vaccine  You may need at least one dose of MMR. You may also need a second dose. Meningococcal conjugate (MenACWY) vaccine  One dose is recommended if you are age 19-21 years and a first-year college student living in a residence hall, or if you have one of several medical conditions. You may also need additional booster doses. Pneumococcal conjugate (PCV13) vaccine  You may need  this if you have certain conditions and were not previously vaccinated. Pneumococcal polysaccharide (PPSV23) vaccine  You may need one or two doses if you smoke cigarettes or if you have certain conditions. Hepatitis A vaccine  You may need this if you have certain conditions or if you travel or work in places where you may be exposed to hepatitis A. Hepatitis B vaccine  You may need this if you have certain conditions or if you travel or work in places where you may be exposed to hepatitis B. Haemophilus influenzae type b (Hib) vaccine  You may need this if you have certain conditions. You may receive vaccines as individual doses or as more than one vaccine together in one shot (combination vaccines). Talk with your health care provider about the risks and benefits of combination vaccines. What tests do I need?  Blood tests  Lipid and cholesterol levels. These may be checked every 5 years starting at age 20.  Hepatitis C test.  Hepatitis B test. Screening  Diabetes screening. This is done by checking your blood sugar (glucose) after you have not eaten for a while (fasting).  Sexually transmitted disease (STD) testing.  BRCA-related cancer screening. This may be done if you have a family history of breast, ovarian, tubal, or peritoneal cancers.  Pelvic exam and Pap test. This may be done every 3 years starting at age 21. Starting at age 30, this may be done every 5 years if you have a Pap test in combination with an HPV test. Talk with your health care provider about your test results, treatment options, and if necessary, the need for more tests.   Follow these instructions at home: Eating and drinking   Eat a diet that includes fresh fruits and vegetables, whole grains, lean protein, and low-fat dairy.  Take vitamin and mineral supplements as recommended by your health care provider.  Do not drink alcohol if: ? Your health care provider tells you not to drink. ? You are  pregnant, may be pregnant, or are planning to become pregnant.  If you drink alcohol: ? Limit how much you have to 0-1 drink a day. ? Be aware of how much alcohol is in your drink. In the [place] one drink equals one 12 oz bottle of beer (355 mL), one 5 oz glass of wine (148 mL), or one 1 oz glass of hard liquor (44 mL). Lifestyle  Take daily care of your teeth and gums.  Stay active. Exercise for at least 30 minutes on 5 or more days each week.  Do not use any products that contain nicotine or tobacco, such as cigarettes, e-cigarettes, and chewing tobacco. If you need help quitting, ask your health care provider.  If you are sexually active, practice safe sex. Use a condom or other form of birth control (contraception) in order to prevent pregnancy and STIs (sexually transmitted infections). If you plan to become pregnant, see your health care provider for a preconception visit. What's next?  Visit your health care provider once a year for a well check visit.  Ask your health care provider how often you should have your eyes and teeth checked.  Stay up to date on all vaccines. This information is not intended to replace advice given to you by your health care provider. Make sure you discuss any questions you have with your health care provider. Document Revised: 06/04/2018 Document Reviewed: 06/04/2018 Elsevier Patient Education  2020 [employer].

## 2020-08-15 ENCOUNTER — Encounter: Admitting: Certified Nurse Midwife

## 2021-02-05 ENCOUNTER — Telehealth: Payer: Self-pay | Admitting: Certified Nurse Midwife

## 2021-02-05 NOTE — Telephone Encounter (Signed)
Pt needed at Televisit she has moved

## 2021-02-05 NOTE — Telephone Encounter (Signed)
New Message:  Pt states that her PCP took her off her Birth Control Yaz due to elevated levels in her blood work.  She is wanting to discuss other options

## 2021-02-07 ENCOUNTER — Other Ambulatory Visit: Payer: Self-pay

## 2021-02-07 ENCOUNTER — Ambulatory Visit (INDEPENDENT_AMBULATORY_CARE_PROVIDER_SITE_OTHER): Admitting: Certified Nurse Midwife

## 2021-02-07 ENCOUNTER — Encounter: Payer: Self-pay | Admitting: Certified Nurse Midwife

## 2021-02-07 DIAGNOSIS — Z3009 Encounter for other general counseling and advice on contraception: Secondary | ICD-10-CM | POA: Diagnosis not present

## 2021-02-07 NOTE — Progress Notes (Signed)
Virtual Visit via Telephone Note  I connected with Audrey Short on 02/07/21 at 11:30 AM EDT by telephone and verified that I am speaking with the correct person using two identifiers.  Location: Patient: at home Provider: At office    I discussed the limitations, risks, security and privacy concerns of performing an evaluation and management service by telephone and the availability of in person appointments. I also discussed with the patient that there may be a patient responsible charge related to this service. The patient expressed understanding and agreed to proceed.   History of Present Illness: 30 yr female state that she was not feeling well and went to her PCP and they did blood work. Her liver enzymes elevated . Her pcp told her to stop her OCP. She has been off of the pill for 5 wks and her liver functions have improved. She would like to discuss alternative options.    Observations/Objective:  Reviewed all forms of birth control options available including abstinence; fertility period awareness methods; over the counter/barrier methods; hormonal contraceptive medication including  patch, ring, injection,contraceptive implant; hormonal and nonhormonal IUDs; permanent sterilization options including vasectomy and the various tubal sterilization modalities. Discussed that only the para Guard is a non hormonal option, and progestin only options given her history  . Suggested that alternate route of medication would more than likely not affect liver functions due to the absorption of medication in blood stream. She is interested in nexplanon. We discussed nexplanon specifically, insertion a removal, and potential for irregular bleeding. Risks and benefits reviewed.  Questions were answered.    Assessment and Plan: Pt is interested in nexplanon for birth control and menstrual cycle control.   Follow Up Instructions: For placement of nexplanon prn.    I discussed the assessment and  treatment plan with the patient. The patient was provided an opportunity to ask questions and all were answered. The patient agreed with the plan and demonstrated an understanding of the instructions.   The patient was advised to call back or seek an in-person evaluation if the symptoms worsen or if the condition fails to improve as anticipated.  I provided 10 minutes of non-face-to-face time during this encounter.   Doreene Burke, CNM

## 2021-02-07 NOTE — Patient Instructions (Signed)
Etonogestrel implant What is this medicine? ETONOGESTREL (et oh noe JES trel) is a contraceptive (birth control) device. It is used to prevent pregnancy. It can be used for up to 3 years. This medicine may be used for other purposes; ask your health care provider or pharmacist if you have questions. COMMON BRAND NAME(S): Implanon, Nexplanon What should I tell my health care provider before I take this medicine? They need to know if you have any of these conditions:  abnormal vaginal bleeding  blood vessel disease or blood clots  breast, cervical, endometrial, ovarian, liver, or uterine cancer  diabetes  gallbladder disease  heart disease or recent heart attack  high blood pressure  high cholesterol or triglycerides  kidney disease  liver disease  migraine headaches  seizures  stroke  tobacco smoker  an unusual or allergic reaction to etonogestrel, anesthetics or antiseptics, other medicines, foods, dyes, or preservatives  pregnant or trying to get pregnant  breast-feeding How should I use this medicine? This device is inserted just under the skin on the inner side of your upper arm by a health care professional. Talk to your pediatrician regarding the use of this medicine in children. Special care may be needed. Overdosage: If you think you have taken too much of this medicine contact a poison control center or emergency room at once. NOTE: This medicine is only for you. Do not share this medicine with others. What if I miss a dose? This does not apply. What may interact with this medicine? Do not take this medicine with any of the following medications:  amprenavir  fosamprenavir This medicine may also interact with the following medications:  acitretin  aprepitant  armodafinil  bexarotene  bosentan  carbamazepine  certain medicines for fungal infections like fluconazole, ketoconazole, itraconazole and voriconazole  certain medicines to treat  hepatitis, HIV or AIDS  cyclosporine  felbamate  griseofulvin  lamotrigine  modafinil  oxcarbazepine  phenobarbital  phenytoin  primidone  rifabutin  rifampin  rifapentine  St. John's wort  topiramate This list may not describe all possible interactions. Give your health care provider a list of all the medicines, herbs, non-prescription drugs, or dietary supplements you use. Also tell them if you smoke, drink alcohol, or use illegal drugs. Some items may interact with your medicine. What should I watch for while using this medicine? This product does not protect you against HIV infection (AIDS) or other sexually transmitted diseases. You should be able to feel the implant by pressing your fingertips over the skin where it was inserted. Contact your doctor if you cannot feel the implant, and use a non-hormonal birth control method (such as condoms) until your doctor confirms that the implant is in place. Contact your doctor if you think that the implant may have broken or become bent while in your arm. You will receive a user card from your health care provider after the implant is inserted. The card is a record of the location of the implant in your upper arm and when it should be removed. Keep this card with your health records. What side effects may I notice from receiving this medicine? Side effects that you should report to your doctor or health care professional as soon as possible:  allergic reactions like skin rash, itching or hives, swelling of the face, lips, or tongue  breast lumps, breast tissue changes, or discharge  breathing problems  changes in emotions or moods  coughing up blood  if you feel that the implant   may have broken or bent while in your arm  high blood pressure  pain, irritation, swelling, or bruising at the insertion site  scar at site of insertion  signs of infection at the insertion site such as fever, and skin redness, pain or  discharge  signs and symptoms of a blood clot such as breathing problems; changes in vision; chest pain; severe, sudden headache; pain, swelling, warmth in the leg; trouble speaking; sudden numbness or weakness of the face, arm or leg  signs and symptoms of liver injury like dark yellow or brown urine; general ill feeling or flu-like symptoms; light-colored stools; loss of appetite; nausea; right upper belly pain; unusually weak or tired; yellowing of the eyes or skin  unusual vaginal bleeding, discharge Side effects that usually do not require medical attention (report to your doctor or health care professional if they continue or are bothersome):  acne  breast pain or tenderness  headache  irregular menstrual bleeding  nausea This list may not describe all possible side effects. Call your doctor for medical advice about side effects. You may report side effects to FDA at 1-800-FDA-1088. Where should I keep my medicine? This drug is given in a hospital or clinic and will not be stored at home. NOTE: This sheet is a summary. It may not cover all possible information. If you have questions about this medicine, talk to your doctor, pharmacist, or health care provider.  2021 [employer] Standard (2019-07-06 11:33:04)  

## 2021-02-27 ENCOUNTER — Ambulatory Visit (INDEPENDENT_AMBULATORY_CARE_PROVIDER_SITE_OTHER): Admitting: Certified Nurse Midwife

## 2021-02-27 ENCOUNTER — Other Ambulatory Visit: Payer: Self-pay

## 2021-02-27 ENCOUNTER — Encounter: Payer: Self-pay | Admitting: Certified Nurse Midwife

## 2021-02-27 VITALS — BP 112/76 | HR 62 | Resp 16 | Ht 68.0 in | Wt 231.4 lb

## 2021-02-27 DIAGNOSIS — Z30017 Encounter for initial prescription of implantable subdermal contraceptive: Secondary | ICD-10-CM

## 2021-02-27 LAB — POCT URINE PREGNANCY: Preg Test, Ur: NEGATIVE

## 2021-02-27 NOTE — Patient Instructions (Signed)
Nexplanon Instructions After Insertion  Keep bandage clean and dry for 24 hours  May use ice/Tylenol/Ibuprofen for soreness or pain  If you develop fever, drainage or increased warmth from incision site-contact office immediately   

## 2021-02-27 NOTE — Progress Notes (Signed)
Audrey Short is a 30 y.o. year old Caucasian female here for Nexplanon insertion.  No LMP recorded. (Menstrual status: Oral contraceptives)., and her pregnancy test today was negative.  Risks/benefits/side effects of Nexplanon have been discussed and her questions have been answered.  Specifically, a failure rate of 10/998 has been reported, with an increased failure rate if pt takes St. John's Wort and/or antiseizure medicaitons.  Reha Verrilli is aware of the common side effect of irregular bleeding, which the incidence of decreases over time.  BP 112/76   Pulse 62   Resp 16   Ht 5\' 8"  (1.727 m)   Wt 231 lb 6.4 oz (105 kg)   BMI 35.18 kg/m   Results for orders placed or performed in visit on 02/27/21 (from the past 24 hour(s))  POCT urine pregnancy   Collection Time: 02/27/21  8:37 AM  Result Value Ref Range   Preg Test, Ur Negative Negative     She is right-handed, so her left arm, approximately 4 inches proximal from the elbow, was cleansed with alcohol and anesthetized with 2cc of 2% Lidocaine.  The area was cleansed again with betadine and the Nexplanon was inserted per manufacturer's recommendations without difficulty.  A steri-strip and pressure bandage were applied.  Pt was instructed to keep the area clean and dry, remove pressure bandage in 24 hours, and keep insertion site covered with the steri-strip for 3-5 days.  Back up contraception was recommended for 2 weeks.  She was given a card indicating date Nexplanon was inserted and date it needs to be removed. Follow-up PRN problems.  03/01/21 Shailee Foots,CNM

## 2021-08-27 ENCOUNTER — Encounter: Payer: Self-pay | Admitting: Certified Nurse Midwife

## 2022-07-24 ENCOUNTER — Encounter: Payer: Self-pay | Admitting: Certified Nurse Midwife
